# Patient Record
Sex: Female | Born: 1990 | Race: White | Hispanic: No | Marital: Single | Smoking: Never smoker
Health system: Southern US, Community
[De-identification: ages and names within clinical notes are randomized; demographics above are authoritative.]

## PROBLEM LIST (undated history)

## (undated) DIAGNOSIS — F419 Anxiety disorder, unspecified: Secondary | ICD-10-CM

## (undated) DIAGNOSIS — J302 Other seasonal allergic rhinitis: Secondary | ICD-10-CM

## (undated) DIAGNOSIS — G43909 Migraine, unspecified, not intractable, without status migrainosus: Secondary | ICD-10-CM

## (undated) DIAGNOSIS — T7840XA Allergy, unspecified, initial encounter: Secondary | ICD-10-CM

## (undated) HISTORY — DX: Allergy, unspecified, initial encounter: T78.40XA

## (undated) HISTORY — DX: Other seasonal allergic rhinitis: J30.2

## (undated) HISTORY — DX: Anxiety disorder, unspecified: F41.9

## (undated) HISTORY — PX: WISDOM TOOTH EXTRACTION: SHX21

## (undated) HISTORY — DX: Migraine, unspecified, not intractable, without status migrainosus: G43.909

---

## 2016-04-17 LAB — HM PAP SMEAR: HM PAP: NEGATIVE

## 2017-06-05 ENCOUNTER — Encounter: Payer: Self-pay | Admitting: Family Medicine

## 2017-06-05 ENCOUNTER — Ambulatory Visit (INDEPENDENT_AMBULATORY_CARE_PROVIDER_SITE_OTHER): Payer: 59 | Admitting: Family Medicine

## 2017-06-05 VITALS — BP 108/76 | HR 72 | Temp 98.2°F | Ht 71.75 in | Wt 190.2 lb

## 2017-06-05 DIAGNOSIS — Z13220 Encounter for screening for lipoid disorders: Secondary | ICD-10-CM | POA: Diagnosis not present

## 2017-06-05 DIAGNOSIS — F411 Generalized anxiety disorder: Secondary | ICD-10-CM | POA: Insufficient documentation

## 2017-06-05 DIAGNOSIS — G43109 Migraine with aura, not intractable, without status migrainosus: Secondary | ICD-10-CM | POA: Diagnosis not present

## 2017-06-05 DIAGNOSIS — Z0000 Encounter for general adult medical examination without abnormal findings: Secondary | ICD-10-CM

## 2017-06-05 DIAGNOSIS — Z23 Encounter for immunization: Secondary | ICD-10-CM | POA: Diagnosis not present

## 2017-06-05 DIAGNOSIS — G43909 Migraine, unspecified, not intractable, without status migrainosus: Secondary | ICD-10-CM | POA: Insufficient documentation

## 2017-06-05 DIAGNOSIS — J302 Other seasonal allergic rhinitis: Secondary | ICD-10-CM | POA: Insufficient documentation

## 2017-06-05 LAB — CBC
HCT: 43.1 % (ref 36.0–46.0)
HEMOGLOBIN: 14.5 g/dL (ref 12.0–15.0)
MCHC: 33.6 g/dL (ref 30.0–36.0)
MCV: 95.2 fl (ref 78.0–100.0)
PLATELETS: 309 10*3/uL (ref 150.0–400.0)
RBC: 4.53 Mil/uL (ref 3.87–5.11)
RDW: 13.3 % (ref 11.5–15.5)
WBC: 6.1 10*3/uL (ref 4.0–10.5)

## 2017-06-05 LAB — COMPREHENSIVE METABOLIC PANEL
ALK PHOS: 53 U/L (ref 39–117)
ALT: 8 U/L (ref 0–35)
AST: 10 U/L (ref 0–37)
Albumin: 4.3 g/dL (ref 3.5–5.2)
BILIRUBIN TOTAL: 0.8 mg/dL (ref 0.2–1.2)
BUN: 14 mg/dL (ref 6–23)
CALCIUM: 9.3 mg/dL (ref 8.4–10.5)
CO2: 27 meq/L (ref 19–32)
CREATININE: 0.76 mg/dL (ref 0.40–1.20)
Chloride: 105 mEq/L (ref 96–112)
GFR: 96.16 mL/min (ref >60.00)
Glucose, Bld: 82 mg/dL (ref 70–99)
Potassium: 4.3 mEq/L (ref 3.5–5.1)
Sodium: 139 mEq/L (ref 135–145)
TOTAL PROTEIN: 6.8 g/dL (ref 6.0–8.3)

## 2017-06-05 LAB — LIPID PANEL
CHOL/HDL RATIO: 2
Cholesterol: 179 mg/dL (ref 0–200)
HDL: 75.4 mg/dL (ref >39.00)
LDL Cholesterol: 95 mg/dL (ref 0–99)
NONHDL: 104.01
TRIGLYCERIDES: 45 mg/dL (ref 0.0–149.0)
VLDL: 9 mg/dL (ref 0.0–40.0)

## 2017-06-05 NOTE — Progress Notes (Signed)
Phone: [phone]  Subjective:  Patient presents today to establish care.  Prior patient of Haze RushingDeborah Cobb with [facility]. Chief complaint-noted.   See problem oriented charting  The following were reviewed and entered/updated in epic: Past Medical History:  Diagnosis Date  . Migraines    since gradeschool- advil and hydrates  . Seasonal allergies    claritin prn   Patient Active Problem List   Diagnosis Date Noted  . GAD (generalized anxiety disorder) 06/05/2017  . Seasonal allergies   . Migraines    Past Surgical History:  Procedure Laterality Date  . WISDOM TOOTH EXTRACTION     not general anesthesia    Family History  Problem Relation Age of Onset  . Arthritis Mother   . Hearing loss Father        work related  . Healthy Brother   . Uterine cancer Maternal Grandmother        died in early 6370s  . Pancreatic cancer Maternal Grandfather        in late 3860s  . Hypertension Paternal Grandfather        died at 8390  . Healthy Brother   . Colon cancer Maternal Uncle        in 5560s    Medications- reviewed and updated Current Outpatient Medications  Medication Sig Dispense Refill  . Levonorgestrel (SKYLA) 13.5 MG IUD Place 1 Device vaginally.     Allergies-reviewed and updated No Known Allergies  Social History   Socioeconomic History  . Marital status: Single    Spouse name: None  . Number of children: None  . Years of education: None  . Highest education level: None  Social Needs  . Financial resource strain: None  . Food insecurity - worry: None  . Food insecurity - inability: None  . Transportation needs - medical: None  . Transportation needs - non-medical: None  Occupational History  . None  Tobacco Use  . Smoking status: Never Smoker  . Smokeless tobacco: Never Used  Substance and Sexual Activity  . Alcohol use: Yes    Alcohol/week: 1.2 oz    Types: 2 Standard drinks or equivalent per week  . Drug use: No  . Sexual activity: No    Birth  control/protection: IUD  Other Topics Concern  . None  Social History Narrative   Lives with parents- goal this year is to move out.       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  Didn't want to do that for work.    Moved to [place] and worked at [employer] center for a while.    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       Hobbies: hiking, movies, video games- switch, ps4    ROS--Full ROS was completed Review of Systems  Constitutional: Negative for chills and fever.  HENT: Negative for hearing loss and tinnitus.   Eyes: Negative for blurred vision (wears contacts) and double vision.  Respiratory: Negative for cough and shortness of breath.   Cardiovascular: Negative for chest pain and palpitations.  Gastrointestinal: Negative for heartburn and nausea.  Genitourinary: Negative for dysuria and urgency.  Musculoskeletal: Negative for myalgias and neck pain.  Skin: Negative for itching and rash.  Neurological: Positive for headaches (very sparing migraine). Negative for dizziness.  Endo/Heme/Allergies: Negative for polydipsia. Does not bruise/bleed easily.  Psychiatric/Behavioral: Negative for hallucinations and substance abuse.   Objective: BP  108/76 (BP Location: Left Arm, Patient Position: Sitting, Cuff Size: Large)   Pulse 72   Temp 98.2 F (36.8 C) (Oral)   Ht 5' 11.75" (1.822 m)   Wt 190 lb 3.2 oz (86.3 kg)   LMP 06/30/2016   SpO2 98%   BMI 25.98 kg/m  Gen: NAD, resting comfortably, tall HEENT: Mucous membranes are moist. Oropharynx normal. TM normal. Eyes: sclera and lids normal, PERRLA Neck: no thyromegaly, no cervical lymphadenopathy CV: RRR no murmurs rubs or gallops Lungs: CTAB no crackles, wheeze, rhonchi Abdomen: soft/nontender/nondistended/normal bowel sounds. No rebound or guarding. Mildly overweight Ext: no edema Skin: warm, dry Neuro: 5/5 strength in upper and lower extremities,  normal gait, normal reflexes  Assessment/Plan:  27 y.o. female presenting for annual physical.  Health Maintenance counseling: 1. Anticipatory guidance: Patient counseled regarding regular dental exams -q6 months Dr. Dutch Quint, eye exams - yearly for contacts Dr. Hyacinth Meeker- sees in february, wearing seatbelts.  2. Risk factor reduction:  Advised patient of need for regular exercise and diet rich and fruits and vegetables to reduce risk of heart attack and stroke. Exercise- has started back 3x a week. Diet/weight- weight still largely reasonable- mildly overweight - had gotten down as low as 155. She woud like to get back to 150-160 range.   Wt Readings from Last 3 Encounters:  06/05/17 190 lb 3.2 oz (86.3 kg)  3. Immunizations/screenings/ancillary studies-declines flu.  Health Maintenance Due  Topic Date Due  . HIV Screening - has done this with GYN as well as donates blood regularly 03/18/2004  . TETANUS/TDAP - today 03/18/2008  . PAP SMEAR - get records 03/18/2010   4. Cervical cancer screening-  Get records is up to date though as of nov 2017 5. Breast cancer screening-  breast exam today and mammogram - start age 42 6. Colon cancer screening - no family history in first degree relative (uncle had this but mom was screened and was negative) , start at age 80-50 7. Skin cancer screening- advised regular sunscreen use. Denies worrisome, changing, or new skin lesions.  8. Birth control/STD check- IUD/ has done with GYN STD screening  Status of chronic or acute concerns   155 best weight. Wants to get in 150-160 range.   At [facility]- abd pain years ago. Appears treated for reflux. Also saw GI and was told no major issues.  No concerns at present.   Preventative health care - Plan: CBC, Comprehensive metabolic panel, Lipid panel  Seasonal allergies  Migraine with aura and without status migrainosus, not intractable - Plan: CBC, Comprehensive metabolic panel  GAD (generalized anxiety  disorder)  Screening for hyperlipidemia - Plan: Lipid panel  Tdap given. Should still be able to donate blood next week still as not a live vaccine.  Return precautions advised.  Tana Conch, MD

## 2017-06-05 NOTE — Patient Instructions (Addendum)
Sign release of information at the check out desk for last pap smear results from Dr. [place] CloudHorvath  Tdap today  Declines flu shot  Please stop by lab before you go

## 2017-07-03 ENCOUNTER — Encounter: Payer: Self-pay | Admitting: Family Medicine

## 2018-06-18 ENCOUNTER — Ambulatory Visit: Payer: 59 | Admitting: Family Medicine

## 2018-06-23 ENCOUNTER — Ambulatory Visit: Payer: 59 | Admitting: Family Medicine

## 2018-06-23 ENCOUNTER — Encounter: Payer: Self-pay | Admitting: Family Medicine

## 2018-06-23 VITALS — BP 100/70 | HR 101 | Temp 98.3°F | Ht 71.5 in | Wt 198.8 lb

## 2018-06-23 DIAGNOSIS — J40 Bronchitis, not specified as acute or chronic: Secondary | ICD-10-CM | POA: Diagnosis not present

## 2018-06-23 DIAGNOSIS — J329 Chronic sinusitis, unspecified: Secondary | ICD-10-CM | POA: Diagnosis not present

## 2018-06-23 DIAGNOSIS — B9689 Other specified bacterial agents as the cause of diseases classified elsewhere: Secondary | ICD-10-CM

## 2018-06-23 MED ORDER — AMOXICILLIN 875 MG PO TABS: 875.0000 mg | ORAL_TABLET | Freq: Two times a day (BID) | ORAL | 0 refills | Status: DC

## 2018-06-23 MED ORDER — FLUTICASONE PROPIONATE 50 MCG/ACT NA SUSP: 2.0000 | Freq: Every day | NASAL | 6 refills | Status: DC

## 2018-06-23 MED ORDER — HYDROCODONE-HOMATROPINE 5-1.5 MG/5ML PO SYRP: 5.0000 mL | ORAL_SOLUTION | Freq: Every evening | ORAL | 0 refills | Status: DC | PRN

## 2018-06-23 NOTE — Progress Notes (Signed)
Miranda Holt is a 28 y.o. female here for an acute visit.  History of Present Illness:   I, Waymon Amato, CMA acting as scribe for [employer].   Sore Throat   This is a new problem. The current episode started in the past 7 days. The problem has been gradually improving (but coughing is making it worse). Neither side of throat is experiencing more pain than the other. There has been no fever. The pain is at a severity of 5/10. The pain is mild. Associated symptoms include abdominal pain, coughing, headaches, neck pain and trouble swallowing. Associated symptoms comments: cough. She has tried oral narcotic analgesics, acetaminophen and NSAIDs (Tea) for the symptoms. The treatment provided mild relief.   PMHx, SurgHx, SocialHx, Medications, and Allergies were reviewed in the Visit Navigator and updated as appropriate.  Current Medications:   .  Levonorgestrel (SKYLA) 13.5 MG IUD, Place 1 Device vaginally., Disp: , Rfl:    No Known Allergies   Review of Systems:   Pertinent items are noted in the HPI. Otherwise, ROS is negative.  Vitals:   Vitals:   06/23/18 0938  BP: 100/70  Pulse: (!) 101  Temp: 98.3 F (36.8 C)  TempSrc: Oral  SpO2: 97%  Weight: 198 lb 12.8 oz (90.2 kg)  Height: 5' 11.5" (1.816 m)     Body mass index is 27.34 kg/m.  Physical Exam:   Physical Exam Vitals signs and nursing note reviewed.  HENT:     Head: Normocephalic and atraumatic.     Right Ear: Ear canal normal. A middle ear effusion is present.     Left Ear: Ear canal normal. A middle ear effusion is present.     Nose: Mucosal edema and rhinorrhea present.     Right Sinus: Maxillary sinus tenderness present.     Left Sinus: Maxillary sinus tenderness present.     Mouth/Throat:     Mouth: Mucous membranes are moist.     Pharynx: Posterior oropharyngeal erythema present.     Tonsils: No tonsillar exudate or tonsillar abscesses.  Eyes:     Conjunctiva/sclera: Conjunctivae normal.     Pupils:  Pupils are equal, round, and reactive to light.  Neck:     Musculoskeletal: Normal range of motion and neck supple.  Cardiovascular:     Rate and Rhythm: Normal rate and regular rhythm.     Heart sounds: Normal heart sounds.  Pulmonary:     Effort: Pulmonary effort is normal. No respiratory distress.  Abdominal:     Palpations: Abdomen is soft.  Skin:    General: Skin is warm.  Neurological:     General: No focal deficit present.     Mental Status: She is alert.  Psychiatric:        Mood and Affect: Mood normal.        Behavior: Behavior normal.    Assessment and Plan:   Miranda Holt was seen today for sore throat, cough, chills and generalized body aches.  Diagnoses and all orders for this visit:  Bacterial sinusitis -     fluticasone (FLONASE) 50 MCG/ACT nasal spray; Place 2 sprays into both nostrils daily. -     amoxicillin (AMOXIL) 875 MG tablet; Take 1 tablet (875 mg total) by mouth 2 (two) times daily.  Bronchitis -     HYDROcodone-homatropine (HYCODAN) 5-1.5 MG/5ML syrup; Take 5 mLs by mouth at bedtime as needed for cough.   . Reviewed expectations re: course of current medical issues. . Discussed self-management of  symptoms. . Outlined signs and symptoms indicating need for more acute intervention. . Patient verbalized understanding and all questions were answered. Marland Kitchen Health Maintenance issues including appropriate healthy diet, exercise, and smoking avoidance were discussed with patient. . See orders for this visit as documented in the electronic medical record. . Patient received an After Visit Summary.  CMA served as Neurosurgeon during this visit. History, Physical, and Plan performed by medical provider. The above documentation has been reviewed and is accurate and complete. Helane Rima, D.O.  Helane Rima, DO [facility] 06/23/2018

## 2018-07-06 ENCOUNTER — Ambulatory Visit: Payer: 59 | Admitting: Family Medicine

## 2018-07-06 ENCOUNTER — Encounter: Payer: Self-pay | Admitting: Family Medicine

## 2018-07-06 VITALS — BP 98/62 | HR 80 | Temp 98.4°F | Ht 71.5 in | Wt 201.0 lb

## 2018-07-06 DIAGNOSIS — Z6827 Body mass index (BMI) 27.0-27.9, adult: Secondary | ICD-10-CM

## 2018-07-06 DIAGNOSIS — F411 Generalized anxiety disorder: Secondary | ICD-10-CM | POA: Diagnosis not present

## 2018-07-06 NOTE — Patient Instructions (Addendum)
Health Maintenance Due  Topic Date Due  . INFLUENZA VACCINE - please have HR send [place] a copy of your flu shot 01/01/2018 Patient stated she had this done but unaware of date. Administered at work   Schedule a visit for a physical before march 31st and we will follow up at that time.   Consider lexapro 5mg  - read about that option Consider engaging in the community

## 2018-07-06 NOTE — Progress Notes (Signed)
Phone [phone]   Subjective:  Miranda Holt is a 28 y.o. year old very pleasant female patient who presents for/with See problem oriented charting ROS- admits to intense anxiety. Some feelings of being down related to anxiety. No suicidal ideation. No chest pain or shortness of breath.   BMI monitoring- elevated BMI noted: Body mass index is 27.64 kg/m. Encouraged need for healthy eating, regular exercise- may help with anxiety as well  BMI Metric Follow Up - 07/06/18 1601      BMI Metric Follow Up-Please document annually   BMI Metric Follow Up  Education provided       Past Medical History-  Patient Active Problem List   Diagnosis Date Noted  . Migraines     Priority: Medium  . GAD (generalized anxiety disorder) 06/05/2017    Priority: Low  . Seasonal allergies     Priority: Low   Medications- reviewed and updated Current Outpatient Medications  Medication Sig Dispense Refill  . amoxicillin (AMOXIL) 875 MG tablet Take 1 tablet (875 mg total) by mouth 2 (two) times daily. 20 tablet 0  . fluticasone (FLONASE) 50 MCG/ACT nasal spray Place 2 sprays into both nostrils daily. 16 g 6  . HYDROcodone-homatropine (HYCODAN) 5-1.5 MG/5ML syrup Take 5 mLs by mouth at bedtime as needed for cough. 120 mL 0  . Levonorgestrel (SKYLA) 13.5 MG IUD Place 1 Device vaginally.     No current facility-administered medications for this visit.      Objective:  BP 98/62 (BP Location: Right Arm, Patient Position: Sitting, Cuff Size: Large)   Pulse 80   Temp 98.4 F (36.9 C) (Oral)   Ht 5' 11.5" (1.816 m)   Wt 201 lb (91.2 kg)   LMP 06/22/2018   SpO2 96%   BMI 27.64 kg/m  Gen: NAD, resting comfortably PSych: anxious and tearful at times    Assessment and Plan   GAD (generalized anxiety disorder) S:Patient complains of recent increase in stress and anxiety.   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  -Dad may be  getting job in Software engineer- doesn't have good community outside of parents. Tends to be reserved. Not sure what she would do in that case.  -Pets are getting old and probably wont last long- fancy rat (hamster)- that is probably biggest stressor). She is very close to her - Shy kid since younger. Fear of being judged. 1 best friend- once that dissolved - hard to connect again (friend in HS essentially left her as wanted to focus on papularity) -Treated recently for bronchitis/bacterial sinusitis with amoxicillin- doing much better.  - 1 friend from college- she is a people pleaser and they went ot [employer] so she doesn't assert her needs. Feels like she doesn't matter in those situations -Efforts not rewarded in the past for trying to build relationships  Insurance doesn't cover counseling so going once a month now. Has to pay full thing. Goes about once a month but does find helpful- primarily for venting   Migraines had been worse lately- wondering if it was anticipation of beginning of semester. Now that semester has begun that has improved to baselineIntroverted. CounselorMainly listens and tries to get her to help solve her own problems. A/P: Extended counseling provided today -Ultimately patient wants to continue to work with her counselor - Suggested follow-up before March 31 for a physical and we can check in on current issues - Consider  Lexapro 5 mg- anxiety itself made her not want to trial this medication at present. -We discussed considering engaging in the community -PHQ 9 slightly over 5 but this is primarily due to anxiety   Time Stamp The duration of face-to-face time during this visit was greater than 25 minutes. Greater than 50% of this time was spent in counseling, explanation of diagnosis, planning of further management, and/or coordination of care including counseling over stressors, discussing medication options-discussing importance of follow-up.    Return precautions advised.   Tana ConchStephen Hunter, MD

## 2018-07-08 ENCOUNTER — Encounter: Payer: Self-pay | Admitting: Family Medicine

## 2018-07-08 NOTE — Assessment & Plan Note (Signed)
S:Patient complains of recent increase in stress and anxiety.   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  -Dad may be getting job in Software engineer- doesn't have good community outside of parents. Tends to be reserved. Not sure what she would do in that case.  -Pets are getting old and probably wont last long- fancy rat (hamster)- that is probably biggest stressor). She is very close to her - Shy kid since younger. Fear of being judged. 1 best friend- once that dissolved - hard to connect again (friend in HS essentially left her as wanted to focus on papularity) -Treated recently for bronchitis/bacterial sinusitis with amoxicillin- doing much better.  - 1 friend from college- she is a people pleaser and they went ot [employer] so she doesn't assert her needs. Feels like she doesn't matter in those situations -Efforts not rewarded in the past for trying to build relationships  Insurance doesn't cover counseling so going once a month now. Has to pay full thing. Goes about once a month but does find helpful- primarily for venting   Migraines had been worse lately- wondering if it was anticipation of beginning of semester. Now that semester has begun that has improved to baselineIntroverted. CounselorMainly listens and tries to get her to help solve her own problems. A/P: Extended counseling provided today -Ultimately patient wants to continue to work with her counselor - Suggested follow-up before March 31 for a physical and we can check in on current issues - Consider Lexapro 5 mg- anxiety itself made her not want to trial this medication at present. -We discussed considering engaging in the community -PHQ 9 slightly over 5 but this is primarily due to anxiety

## 2018-07-31 ENCOUNTER — Telehealth: Payer: Self-pay | Admitting: Family Medicine

## 2018-07-31 NOTE — Telephone Encounter (Signed)
See note

## 2018-07-31 NOTE — Telephone Encounter (Signed)
Copied from CRM [id number]. Topic: Quick Communication - See Telephone Encounter >> Jul 31, 2018  5:10 PM Jens Som A wrote: CRM for notification. See Telephone encounter for: 07/31/18.  Patient missed a call. Please advise with the patient thank you

## 2018-08-03 NOTE — Telephone Encounter (Signed)
Found out why pt received phone call. We are trying to figure out when pt received her Flu vaccine. Will try to call pt again.

## 2018-08-03 NOTE — Telephone Encounter (Signed)
Not sure why pt received a phone call. Left message to return phone call.

## 2018-08-04 NOTE — Telephone Encounter (Signed)
Noted  

## 2018-08-04 NOTE — Telephone Encounter (Signed)
Patient states she received flu vaccine 03/17/2018.

## 2018-08-24 ENCOUNTER — Encounter: Payer: 59 | Admitting: Family Medicine

## 2018-08-26 ENCOUNTER — Encounter: Payer: 59 | Admitting: Family Medicine

## 2018-10-01 LAB — HM PAP SMEAR: HM Pap smear: NEGATIVE

## 2018-12-01 ENCOUNTER — Ambulatory Visit (INDEPENDENT_AMBULATORY_CARE_PROVIDER_SITE_OTHER): Payer: 59 | Admitting: Family Medicine

## 2018-12-01 ENCOUNTER — Encounter: Payer: Self-pay | Admitting: Family Medicine

## 2018-12-01 ENCOUNTER — Other Ambulatory Visit: Payer: Self-pay

## 2018-12-01 VITALS — BP 120/82 | HR 97 | Temp 98.2°F | Ht 71.0 in | Wt 208.6 lb

## 2018-12-01 DIAGNOSIS — L989 Disorder of the skin and subcutaneous tissue, unspecified: Secondary | ICD-10-CM

## 2018-12-01 DIAGNOSIS — Z0000 Encounter for general adult medical examination without abnormal findings: Secondary | ICD-10-CM

## 2018-12-01 MED ORDER — TRIAMCINOLONE ACETONIDE 0.1 % EX CREA: 1.0000 "application " | TOPICAL_CREAM | Freq: Two times a day (BID) | CUTANEOUS | 0 refills | Status: DC

## 2018-12-01 NOTE — Progress Notes (Signed)
Phone: [phone]   Subjective:  Patient presents today for their annual physical. Chief complaint-noted.   See problem oriented charting- ROS- full  review of systems was completed and negative except for: headaches, seasonal allergies, anxiety  The following were reviewed and entered/updated in epic: Past Medical History:  Diagnosis Date  . Migraines    since gradeschool- advil and hydrates  . Seasonal allergies    claritin prn   Patient Active Problem List   Diagnosis Date Noted  . Migraines     Priority: Medium  . GAD (generalized anxiety disorder) 06/05/2017    Priority: Low  . Seasonal allergies     Priority: Low   Past Surgical History:  Procedure Laterality Date  . WISDOM TOOTH EXTRACTION     not general anesthesia    Family History  Problem Relation Age of Onset  . Arthritis Mother   . Hearing loss Father        work related  . Healthy Brother   . Uterine cancer Maternal Grandmother        died in early 12s  . Pancreatic cancer Maternal Grandfather        in late 19s  . Hypertension Paternal Grandfather        died at 70  . Healthy Brother   . Colon cancer Maternal Uncle        in 74s    Medications- reviewed and updated Current Outpatient Medications  Medication Sig Dispense Refill  . Levonorgestrel (KYLEENA) 19.5 MG IUD Kyleena 17.5 mcg/24 hrs (90yrs) 19.5mg  intrauterine device  Take 1 device every day by intrauterine route.    . triamcinolone cream (KENALOG) 0.1 % Apply 1 application topically 2 (two) times daily. For 7-10 days maximum 45 g 0   No current facility-administered medications for this visit.     Allergies-reviewed and updated No Known Allergies  Social History   Social History Narrative   Lives with parents- goal this year is to move out.          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   Didn't want to do that for work.    Moved to [place] and worked at [employer] center for a while.    Job at [employer].       Hobbies: hiking, movies, video games- switch, ps4   Objective  Objective:  BP 120/82 (BP Location: Left Arm, Patient Position: Sitting, Cuff Size: Normal)   Pulse 97   Temp 98.2 F (36.8 C)   Ht 5\' 11"  (1.803 m)   Wt 208 lb 9.6 oz (94.6 kg)   SpO2 (!) 81%   BMI 29.09 kg/m  Gen: NAD, resting comfortably HEENT: Mucous membranes are moist. Oropharynx normal Neck: no thyromegaly CV: RRR no murmurs rubs or gallops Lungs: CTAB no crackles, wheeze, rhonchi Abdomen: soft/nontender/nondistended/normal bowel sounds. No rebound or guarding.  Ext: no edema Skin: warm, dry, in left groin red raised area not clearly fluctuant that is painful to deep palpation- likely cyst- some excoriation and dried bandage residue Neuro: grossly normal, moves all extremities, PERRLA    Assessment and Plan   28 y.o. female presenting for annual physical.  Health Maintenance counseling: 1. Anticipatory guidance: Patient counseled regarding regular dental exams -q6 months with Dr. Jola Baptist, eye exams -yearly with Dr. Sabra Heck,  avoiding smoking and second  hand smoke , limiting alcohol to 1 beverage per day .   2. Risk factor reduction:  Advised patient of need for regular exercise and diet rich and fruits and vegetables to reduce risk of heart attack and stroke. Exercise- has been tough covid 19- no regular intentional exercise- plans to start this. Diet-feels like overeating- feels she needs to get a handle on that- feels like needs better self control.  Overweight status noted-weight creeping up.  190 last physical. Wt Readings from Last 3 Encounters:  12/01/18 208 lb 9.6 oz (94.6 kg)  07/06/18 201 lb (91.2 kg)  06/23/18 198 lb 12.8 oz (90.2 kg)  3. Immunizations/screenings/ancillary studies- up to date Immunization History  Administered Date(s) Administered  .  Influenza-Unspecified 03/17/2018  . Tdap 06/05/2017  4. Cervical cancer screening- follows with Dr. [place] CloudHorvath. Last pap we have on file 04/17/2016- had pap smear April 3rd and not sure if had pap 5. Breast cancer screening-  breast exam-with Dr. Kirby FunkHorvath-and mammogram- likely yearly starting at age 7-no family history 6. Colon cancer screening - maternal uncle in 4260s- would start screening at least age 30, consider 4845 if any other family history develops 7. Skin cancer screening- no dermatologist. advised regular sunscreen use. Denies worrisome, changing, or new skin lesions.  8. Birth control/STD check- IUD in place- recently placed-STD screening with gynecology- not active since last time tested 9. Osteoporosis screening at 65-we will plan on this 10.  Never smoker  Status of chronic or acute concerns  Generalized Anxiety Disorder - continues to work with therapist- therapist thought lexapro 5 mg was a good idea. Her mother was against it. Working at home and her quality of life has improved markedly- several stressors now gone. Parents didn't move. Her fancy rats died x1 and had to put the other one down. Still worried about work, school (I believe finishing later this year), what future holds. At least social anxiety is bette.  - still considering lexapro 5 mg low dose- she will reach out if wants to start. Already counseled about medicine today  Seasonal Allergies - takes sudafed- discussed can contribute to anxiety. Didn't like flonase- burning sensation in nose. Used to take claritin.   Migraines - migraines a few times a year.   Headaches on daily basis- tends to happen in spring. Ibuprofen still helps both migraines and tension headaches  Left groin lesion/skin lesion- will refer to dermatology for evaluation. Will trial triamcinolone cream for itch. May be cyst- did a lot of scratching in area nad seemed to enlarge from under half a cm.   1 year physical or sooner if needed Lab/Order  associations: no labs today    ICD-10-CM   1. Preventative health care  Z00.00   2. Skin lesion of left leg  L98.9 Ambulatory referral to Dermatology   Meds ordered this encounter  Medications  . triamcinolone cream (KENALOG) 0.1 %    Sig: Apply 1 application topically 2 (two) times daily. For 7-10 days maximum    Dispense:  45 g    Refill:  0    Return precautions advised.  Tana ConchStephen Giuliana [place], MD

## 2018-12-01 NOTE — Patient Instructions (Addendum)
Sign release of information at the check out desk for last pap smear from Dr. Philis Pique- last we have on file is 2017  We will call you within two weeks about your referral to dermatology. If you do not hear within 3 weeks, give [place] a call. Try triamcinolone cream for the itch. If area gets bigger/redder/more painful let us know- we may have to do an incision and drainage- though I think its just an irritated cyst- not an infected cyst at present.   We opted to hold off on labs as looked good in 2019- can repeat next year. If work needs them we can still add these at later date.   1 year physical or sooner if needed   If we start lexapro (we can do this without a visit since we discussed the plan today) - want to have already gone over this info --> Taking the medicine as directed and not missing any doses is one of the best things you can do to treat your anxiety.  Here are some things to keep in mind:  1) Side effects (stomach upset, some increased anxiety) may happen before you notice a benefit.  These side effects typically go away over time. 2) Changes to your dose of medicine or a change in medication all together is sometimes necessary 3) Most people need to be on medication at least 6-12 months 4) Many people will notice an improvement within two weeks but the full effect of the medication can take up to 4-6 weeks 5) Stopping the medication when you start feeling better often results in a return of symptoms 6) If you start having thoughts of hurting yourself or others after starting this medicine, call our office immediately at [phone] or seek care through 911.   7) mild increased risk of GI bleed with lexapro and advil

## 2019-08-25 ENCOUNTER — Encounter: Payer: Self-pay | Admitting: Family Medicine

## 2019-12-02 ENCOUNTER — Encounter: Payer: Self-pay | Admitting: Family Medicine

## 2019-12-02 ENCOUNTER — Ambulatory Visit: Payer: No Typology Code available for payment source | Admitting: Family Medicine

## 2019-12-02 ENCOUNTER — Other Ambulatory Visit: Payer: Self-pay

## 2019-12-02 VITALS — BP 100/80 | HR 104 | Temp 99.5°F | Ht 71.0 in | Wt 211.4 lb

## 2019-12-02 DIAGNOSIS — Z13220 Encounter for screening for lipoid disorders: Secondary | ICD-10-CM

## 2019-12-02 DIAGNOSIS — Z1159 Encounter for screening for other viral diseases: Secondary | ICD-10-CM

## 2019-12-02 DIAGNOSIS — F411 Generalized anxiety disorder: Secondary | ICD-10-CM | POA: Diagnosis not present

## 2019-12-02 DIAGNOSIS — Z0001 Encounter for general adult medical examination with abnormal findings: Secondary | ICD-10-CM

## 2019-12-02 DIAGNOSIS — Z0000 Encounter for general adult medical examination without abnormal findings: Secondary | ICD-10-CM | POA: Diagnosis not present

## 2019-12-02 DIAGNOSIS — G43109 Migraine with aura, not intractable, without status migrainosus: Secondary | ICD-10-CM | POA: Diagnosis not present

## 2019-12-02 MED ORDER — SUMATRIPTAN SUCCINATE 50 MG PO TABS: 50.0000 mg | ORAL_TABLET | ORAL | 1 refills | Status: DC | PRN

## 2019-12-02 MED ORDER — HYDROXYZINE HCL 25 MG PO TABS: 25.0000 mg | ORAL_TABLET | Freq: Three times a day (TID) | ORAL | 2 refills | Status: DC | PRN

## 2019-12-02 NOTE — Progress Notes (Signed)
Phone [phone] In person visit   Subjective:   Miranda Holt is a 29 y.o. year old very pleasant female patient who presents for/with See problem oriented charting This visit occurred during the SARS-CoV-2 public health emergency.  Safety protocols were in place, including screening questions prior to the visit, additional usage of staff PPE, and extensive cleaning of exam room while observing appropriate contact time as indicated for disinfecting solutions.   Past Medical History-  Patient Active Problem List   Diagnosis Date Noted  . Migraines     Priority: Medium  . GAD (generalized anxiety disorder) 06/05/2017    Priority: Low  . Seasonal allergies     Priority: Low    Medications- reviewed and updated Current Outpatient Medications  Medication Sig Dispense Refill  . Levonorgestrel (KYLEENA) 19.5 MG IUD Kyleena 17.5 mcg/24 hrs (9yrs) 19.5mg  intrauterine device  Take 1 device every day by intrauterine route.    . hydrOXYzine (ATARAX/VISTARIL) 25 MG tablet Take 1 tablet (25 mg total) by mouth every 8 (eight) hours as needed for anxiety. 60 tablet 2  . SUMAtriptan (IMITREX) 50 MG tablet Take 1 tablet (50 mg total) by mouth every 2 (two) hours as needed for migraine (max 2 per day. max 2 days a week.). May repeat in 2 hours if headache persists or recurs. 10 tablet 1  . triamcinolone cream (KENALOG) 0.1 % Apply 1 application topically 2 (two) times daily. For 7-10 days maximum 45 g 0   No current facility-administered medications for this visit.     Objective:  BP 100/80   Pulse (!) 104   Temp 99.5 F (37.5 C)   Ht 5\' 11"  (1.803 m)   Wt 211 lb 6.4 oz (95.9 kg)   SpO2 95%   BMI 29.48 kg/m  Gen: NAD, resting comfortably No sinus pressure Neuro: CN II-XII intact, sensation and reflexes normal throughout, 5/5 muscle strength in bilateral upper and lower extremities. Normal finger to nose. Normal rapid alternating movements. No pronator drift. Normal romberg. Normal gait.       Assessment and Plan   GAD (generalized anxiety disorder) # Anxiety/GAD S:Medication: had considered lexapro 5 mg but patient and mom have thought more about an as needed medicine.   - CBD oil has been tried but not helpful. Very sparingly shot of whisky will help. Does not regularly - on a bad week would have 2 shots.  Counseling:  Has stopped working with her therapist- didn't feel a good connection towards the end of her sessions and would like a new therapist.  GAD 7 : Generalized Anxiety Score 12/02/2019 12/01/2018 07/06/2018  Nervous, Anxious, on Edge 3 2 3   Control/stop worrying 3 2 3   Worry too much - different things 3 2 3   Trouble relaxing 0 1 2  Restless 0 1 2  Easily annoyed or irritable 2 1 3   Afraid - awful might happen 2 1 3   Total GAD 7 Score 13 10 19   Anxiety Difficulty Somewhat difficult Somewhat difficult Somewhat difficult  A/P: poor control- will trial hydroxyzine as she would prefer to use an as needed medication over Lexapro for now. -Next potential treatment we would consider buspirone -After that would consider Lexapro if needed -Also we are going to give her the name and number of our behavioral health to see if we can get her plugged in there  Migraines  S:sudafed and advill together tend to help headaches and perceived congestion. Has a lot of frontal sinus pressure. Blows out  clear discharge at times. Has taken allegra in the past without relief. Almost daily issue for months- takes sudafed and advil daily.   Usually one sided above eye and lasts 4 hours. Can nausea with them.   This feels different from her migraines- has no aura.    Not related to headaches has had times when feels like room is moving with changes in head position- like a slight motion sensation but getting slightly better. Feeling of still being in ocean like when you go to beach and then come back to room and get in bed or after headache felt like could still feel movement. No hearing  loss or ringing in the ears. No facial or extremity weakness. No slurred words or trouble swallowing. no blurry vision or double vision. No paresthesias. No confusion or word finding difficulties.  A/P: possible sinusitis vs. Severe allergies vs. Migraines vs. Medication overuse headaches - reassuring neurological exam  -trial off advil for 3-4 weeks to see if we can break cycle - can use sumatriptan up to twice a week- if not effective should stop -happy to place neurology referral if not improving.  - if worsening sinus pressure or change in color- id be willing to send in antibiotic to see if that helps - on progesterone only birth control - kyleena - if she were to change plans on pregnancy (abstinent plus kyleena)- she knows to let us know as hydroxyzine and sumatriptan not advised in pregnancy  For vertigo- does not seem related If you have recurrent issues of feeling like room moving Alpha Gula, MD Vertigo Treatment Oct 11 [email or web address]   Recommended follow up: Return in about 3 months (around 03/03/2020) for follow up- or sooner if needed.   Lab/Order associations:   ICD-10-CM   2. GAD (generalized anxiety disorder)  F41.1 CBC with Differential/Platelet    Comprehensive metabolic panel  3. Migraine with aura and without status migrainosus, not intractable  G43.109 CBC with Differential/Platelet    Meds ordered this encounter  Medications  . hydrOXYzine (ATARAX/VISTARIL) 25 MG tablet    Sig: Take 1 tablet (25 mg total) by mouth every 8 (eight) hours as needed for anxiety.    Dispense:  60 tablet    Refill:  2  . SUMAtriptan (IMITREX) 50 MG tablet    Sig: Take 1 tablet (50 mg total) by mouth every 2 (two) hours as needed for migraine (max 2 per day. max 2 days a week.). May repeat in 2 hours if headache persists or recurs.    Dispense:  10 tablet    Refill:  1    Return precautions advised.  Tana Conch, MD

## 2019-12-02 NOTE — Assessment & Plan Note (Addendum)
  S:sudafed and advill together tend to help headaches and perceived congestion. Has a lot of frontal sinus pressure. Blows out clear discharge at times. Has taken allegra in the past without relief. Almost daily issue for months- takes sudafed and advil daily.   Usually one sided above eye and lasts 4 hours. Can nausea with them.   This feels different from her migraines- has no aura.    Not related to headaches has had times when feels like room is moving with changes in head position- like a slight motion sensation but getting slightly better. Feeling of still being in ocean like when you go to beach and then come back to room and get in bed or after headache felt like could still feel movement. No hearing loss or ringing in the ears. No facial or extremity weakness. No slurred words or trouble swallowing. no blurry vision or double vision. No paresthesias. No confusion or word finding difficulties.  A/P: possible sinusitis vs. Severe allergies vs. Migraines vs. Medication overuse headaches - reassuring neurological exam  -trial off advil for 3-4 weeks to see if we can break cycle - can use sumatriptan up to twice a week- if not effective should stop -happy to place neurology referral if not improving.  - if worsening sinus pressure or change in color- id be willing to send in antibiotic to see if that helps - on progesterone only birth control - kyleena - if she were to change plans on pregnancy (abstinent plus kyleena)- she knows to let us know as hydroxyzine and sumatriptan not advised in pregnancy  For vertigo- does not seem related If you have recurrent issues of feeling like room moving Alpha Gula, MD Vertigo Treatment Oct 11 [email or web address]

## 2019-12-02 NOTE — Progress Notes (Signed)
Phone [phone]   Subjective:  Patient presents today for their annual physical. Chief complaint-noted.   See problem oriented charting- ROS- full  review of systems was completed and negative except for: headaches vs. Migraines, vertigo at times not related, anxiety  The following were reviewed and entered/updated in epic: Past Medical History:  Diagnosis Date  . Migraines    since gradeschool- advil and hydrates  . Seasonal allergies    claritin prn   Patient Active Problem List   Diagnosis Date Noted  . Migraines     Priority: Medium  . GAD (generalized anxiety disorder) 06/05/2017    Priority: Low  . Seasonal allergies     Priority: Low   Past Surgical History:  Procedure Laterality Date  . WISDOM TOOTH EXTRACTION     not general anesthesia    Family History  Problem Relation Age of Onset  . Arthritis Mother   . Hearing loss Father        work related  . Healthy Brother   . Uterine cancer Maternal Grandmother        died in early 51s  . Pancreatic cancer Maternal Grandfather        in late 31s  . Hypertension Paternal Grandfather        died at 23  . Healthy Brother   . Colon cancer Maternal Uncle        in 51s    Medications- reviewed and updated Current Outpatient Medications  Medication Sig Dispense Refill  . Levonorgestrel (KYLEENA) 19.5 MG IUD Kyleena 17.5 mcg/24 hrs (42yrs) 19.5mg  intrauterine device  Take 1 device every day by intrauterine route.    . triamcinolone cream (KENALOG) 0.1 % Apply 1 application topically 2 (two) times daily. For 7-10 days maximum 45 g 0   No current facility-administered medications for this visit.    Allergies-reviewed and updated No Known Allergies  Social History   Social History Narrative   Lives with parents- goal this year is to move out.          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  Didn't want to do that for work.    Moved to [place] and worked at [employer] center for a while.    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       Hobbies: hiking, movies, video games- switch, ps4   Objective  Objective:  BP 100/80   Pulse (!) 104   Temp 99.5 F (37.5 C)   Ht 5\' 11"  (1.803 m)   Wt 211 lb 6.4 oz (95.9 kg)   SpO2 95%   BMI 29.48 kg/m  Gen: NAD, resting comfortably HEENT: Mucous membranes are moist. Oropharynx normal Neck: no thyromegaly CV: RRR no murmurs rubs or gallops. HR high normal on my exam  Lungs: CTAB no crackles, wheeze, rhonchi Abdomen: soft/nontender/nondistended/normal bowel sounds. No rebound or guarding.  Ext: no edema Skin: warm, dry Neuro: grossly normal, moves all extremities, PERRLA. Also see full neuro on separate sheet.    Assessment and Plan   29 y.o. female presenting for annual physical.  Health Maintenance counseling: 1. Anticipatory guidance: Patient counseled regarding regular dental exams q6 months Dr. 26, eye exams with Dr. Dutch Quint for contacts,  avoiding smoking and second hand smoke, limiting alcohol to 1 beverage per day- 1 per week.   2.  Risk factor reduction:  Advised patient of need for regular exercise and diet rich and fruits and vegetables to reduce risk of heart attack and stroke. Exercise- could be better- just started right before visit. Diet-cooks mostly at home. She feels she could cut down on carbs.  Wt Readings from Last 3 Encounters:  12/02/19 211 lb 6.4 oz (95.9 kg)  12/01/18 208 lb 9.6 oz (94.6 kg)  07/06/18 201 lb (91.2 kg)  3. Immunizations/screenings/ancillary studies- did not get flu shot 2020.  Immunization History  Administered Date(s) Administered  . Influenza-Unspecified 03/17/2018  . Janssen (J&J) SARS-COV-2 Vaccination 09/11/2019  . Tdap 06/05/2017  4. Cervical cancer screening- with Dr. Henderson Cloud. Saw march 23- thinks they did pap- we will get recors 5. Breast cancer screening-  breast exam  with GYN and mammogram - start at 40, possible baseline at 33 6. Colon cancer screening - colon cancer maternal uncle in 7s- still would start her screening at 49.  7. Skin cancer screening- ended up not seeing dermatology because area cleared up that we were concerned about last year. advised regular sunscreen use. Denies worrisome, changing, or new skin lesions.  8. Birth control/STD check- IUD and abstinence- declines std screening 9. Osteoporosis screening at 22- will plan on this -Never smoker  Status of chronic or acute concerns   See problem oriented separate visit/charting  Occasional small bumps on roof of mouth or tongue- that eventually go away. Main triggers- muffins or some bread products, cheeses. Will monitor if do not resolve. Muffins seem to bother her the most. Could be preservative or packaging related as well- could try home cooked muffins to see if similar. Might be a sensitivity- will monitor. If worsening can let us know   Screen hyperlipidemia- she requests repeat Lab Results  Component Value Date   CHOL 179 06/05/2017   HDL 75.40 06/05/2017   LDLCALC 95 06/05/2017   TRIG 45.0 06/05/2017   CHOLHDL 2 06/05/2017   Recommended follow up: Return in about 3 months (around 03/03/2020) for follow up- or sooner if needed.  Lab/Order associations:not fasting   ICD-10-CM   1. Encounter for general adult medical examination with abnormal findings  Z00.01 CBC with Differential/Platelet    Comprehensive metabolic panel    Lipid panel    Hepatitis C Antibody  4. Encounter for hepatitis C screening test for low risk patient  Z11.59 Hepatitis C Antibody   Return precautions advised.  Tana Conch, MD

## 2019-12-02 NOTE — Patient Instructions (Addendum)
Please stop by lab before you go If you have mychart- we will send your results within 3 business days of [place] receiving them.  If you do not have mychart- we will call you about results within 5 business days of [place] receiving them.   Stop by the front desk and fill out a release of information to have a copy of your most recent PAP sent to [place].  Trial hydroxyzine for anxiety as needed. Be cautious about tiredness/driving for 6-8 hours after using.   Please call [phone] to schedule a visit with [facility] -Colen Darling is an excellent counselor who is based out of our clinic  possible sinusitis vs. Severe allergies vs. Migraines vs. Medication overuse headaches -trial off advil for 3-4 weeks to see if we can break cycle - can use sumatriptan up to twice a week- if not effective should stop -happy to place neurology referral if not improving.  - if worsening sinus pressure or change in color- id be willing to send in antibiotic to see if that helps -could retrial allegra or claritin  If you have recurrent issues of feeling like room moving Alpha Gula, MD Vertigo Treatment Oct 11 [email or web address]  - if she were to change plans on pregnancy (abstinent plus kyleena)- she knows to let us know as hydroxyzine and sumatriptan not advised in pregnancy

## 2019-12-02 NOTE — Assessment & Plan Note (Signed)
#   Anxiety/GAD S:Medication: had considered lexapro 5 mg but patient and mom have thought more about an as needed medicine.   - CBD oil has been tried but not helpful. Very sparingly shot of whisky will help. Does not regularly - on a bad week would have 2 shots.  Counseling:  Has stopped working with her therapist- didn't feel a good connection towards the end of her sessions and would like a new therapist.  GAD 7 : Generalized Anxiety Score 12/02/2019 12/01/2018 07/06/2018  Nervous, Anxious, on Edge 3 2 3   Control/stop worrying 3 2 3   Worry too much - different things 3 2 3   Trouble relaxing 0 1 2  Restless 0 1 2  Easily annoyed or irritable 2 1 3   Afraid - awful might happen 2 1 3   Total GAD 7 Score 13 10 19   Anxiety Difficulty Somewhat difficult Somewhat difficult Somewhat difficult  A/P: poor control- will trial hydroxyzine as she would prefer to use an as needed medication over Lexapro for now. -Next potential treatment we would consider buspirone -After that would consider Lexapro if needed -Also we are going to give her the name and number of our behavioral health to see if we can get her plugged in there

## 2019-12-03 LAB — COMPREHENSIVE METABOLIC PANEL
ALT: 9 U/L (ref 0–35)
AST: 10 U/L (ref 0–37)
Albumin: 4.8 g/dL (ref 3.5–5.2)
Alkaline Phosphatase: 54 U/L (ref 39–117)
BUN: 15 mg/dL (ref 6–23)
CO2: 24 mEq/L (ref 19–32)
Calcium: 9.8 mg/dL (ref 8.4–10.5)
Chloride: 102 mEq/L (ref 96–112)
Creatinine, Ser: 0.79 mg/dL (ref 0.40–1.20)
GFR: 85.05 mL/min (ref >60.00)
Glucose, Bld: 85 mg/dL (ref 70–99)
Potassium: 4.1 mEq/L (ref 3.5–5.1)
Sodium: 136 mEq/L (ref 135–145)
Total Bilirubin: 0.4 mg/dL (ref 0.2–1.2)
Total Protein: 7.4 g/dL (ref 6.0–8.3)

## 2019-12-03 LAB — LIPID PANEL
Cholesterol: 232 mg/dL — ABNORMAL HIGH (ref 0–200)
HDL: 75.4 mg/dL (ref >39.00)
LDL Cholesterol: 143 mg/dL — ABNORMAL HIGH (ref 0–99)
NonHDL: 156.48
Total CHOL/HDL Ratio: 3
Triglycerides: 68 mg/dL (ref 0.0–149.0)
VLDL: 13.6 mg/dL (ref 0.0–40.0)

## 2019-12-03 LAB — CBC WITH DIFFERENTIAL/PLATELET
Basophils Absolute: 0.1 10*3/uL (ref 0.0–0.1)
Basophils Relative: 1 % (ref 0.0–3.0)
Eosinophils Absolute: 0.1 10*3/uL (ref 0.0–0.7)
Eosinophils Relative: 0.9 % (ref 0.0–5.0)
HCT: 43.7 % (ref 36.0–46.0)
Hemoglobin: 15.2 g/dL — ABNORMAL HIGH (ref 12.0–15.0)
Lymphocytes Relative: 31.6 % (ref 12.0–46.0)
Lymphs Abs: 2.6 10*3/uL (ref 0.7–4.0)
MCHC: 34.7 g/dL (ref 30.0–36.0)
MCV: 94 fl (ref 78.0–100.0)
Monocytes Absolute: 0.5 10*3/uL (ref 0.1–1.0)
Monocytes Relative: 6 % (ref 3.0–12.0)
Neutro Abs: 5 10*3/uL (ref 1.4–7.7)
Neutrophils Relative %: 60.5 % (ref 43.0–77.0)
Platelets: 328 10*3/uL (ref 150.0–400.0)
RBC: 4.64 Mil/uL (ref 3.87–5.11)
RDW: 12.8 % (ref 11.5–15.5)
WBC: 8.3 10*3/uL (ref 4.0–10.5)

## 2019-12-07 LAB — HEPATITIS C ANTIBODY
Hepatitis C Ab: NONREACTIVE
SIGNAL TO CUT-OFF: 0.01 (ref <1.00)

## 2020-03-03 ENCOUNTER — Encounter: Payer: Self-pay | Admitting: Family Medicine

## 2020-03-03 ENCOUNTER — Ambulatory Visit (INDEPENDENT_AMBULATORY_CARE_PROVIDER_SITE_OTHER): Payer: PRIVATE HEALTH INSURANCE | Admitting: Family Medicine

## 2020-03-03 ENCOUNTER — Other Ambulatory Visit: Payer: Self-pay

## 2020-03-03 VITALS — BP 116/62 | HR 71 | Temp 99.5°F | Resp 18 | Ht 71.0 in | Wt 192.0 lb

## 2020-03-03 DIAGNOSIS — F411 Generalized anxiety disorder: Secondary | ICD-10-CM | POA: Diagnosis not present

## 2020-03-03 DIAGNOSIS — G43109 Migraine with aura, not intractable, without status migrainosus: Secondary | ICD-10-CM

## 2020-03-03 MED ORDER — ESCITALOPRAM OXALATE 10 MG PO TABS: 10.0000 mg | ORAL_TABLET | Freq: Every day | ORAL | 5 refills | Status: DC

## 2020-03-03 NOTE — Assessment & Plan Note (Signed)
S:Medication:  hydroxyzine trial last visit. She preferred not to use daily medicine like lexapro at that time. Also had considered buspirone. Gave her # for behavioral health  Counseling: did not have the best fit with prior therapist and costly. Prefers in person- not a lot of great options for any person or easily finding in person therapist  She has done very well with hydroxyzine at night and helps her sleep- had been having issues sleeping due to anxiety before. She is unable to tolerate in the daytime as makes her too sleepy.   Stressors right now are work and worries about the future- parents being gone. Moved into townhome A/P: Generalized anxiety disorder remains poorly controlled.  Slight improvement on hydroxyzine but still not at goal.  We are going to start Lexapro 10 mg-we will start with 1/2 tablet for a week and then increase to full tablet.  We will follow-up in 8 weeks.  If she has new or worsening symptoms let us know

## 2020-03-03 NOTE — Patient Instructions (Addendum)
Health Maintenance Due  Topic Date Due  . INFLUENZA VACCINE - declined 01/02/2020   Trial lexapro 10mg . Start half tablet for first week. Can still use hydroxyzine  Taking the medicine as directed and not missing any doses is one of the best things you can do to treat your anxiety.  Here are some things to keep in mind:  1) Side effects (stomach upset, some increased anxiety) may happen before you notice a benefit.  These side effects typically go away over time. 2) Changes to your dose of medicine or a change in medication all together is sometimes necessary 3) Most people need to be on medication at least 6-12 months 4) Many people will notice an improvement within two weeks but the full effect of the medication can take up to 6-8 weeks 5) Stopping the medication when you start feeling better often results in a return of symptoms 6) If you start having thoughts of hurting yourself or others after starting this medicine, call our office immediately at [phone] or seek care through 911.    Recommended follow up: Return in about 2 months (around 05/03/2020).

## 2020-03-03 NOTE — Assessment & Plan Note (Signed)
#   Migraines/rebound headaches S: Last visit patient was using Sudafed and Advil together for perceived congestion-had a lot of frontal sinus pressure-blows out clear discharge at times.  Allegra in the past was not helpful.  With a daily issue for months and was taking Sudafed and Advil daily-recommended trial off both for at least 3 to 4 weeks.  Was getting some nausea with these episodes and concern could be migraines.  She felt like they were different from her typical migraines and has had no aura.  Reassuring neurological exam last visit.  Discussed using sumatriptan up to twice a week and can try to break rebound headache cycle.  Discussed possible neurology referral.  We discussed if worsening sinus pressure or change in color we could try antibiotic.  She was already on progesterone only birth control [place] and is abstinent.   Possible BPPV- discussed carol foster youtbe videos.  Patient did not mention vertigo today  Sumatriptan did not help so likely not migraines. Cutting out advil was very helpful- using very sparingly now twice a month or less.  A/P: It sounds like patient was certainly having rebound headaches-has gone from daily Advil and Sudafed to extremely sparing use now twice a month or less of Advil.  These headaches were not responsive to sumatriptan so did not sound like her migraines.  She still has sumatriptan on hand for her migraines which are associated with aura -In regards to migraine with aura thankfully she is on progesterone only contraceptive

## 2020-03-03 NOTE — Progress Notes (Signed)
Phone [phone] In person visit   Subjective:   Miranda Holt is a 29 y.o. year old very pleasant female patient who presents for/with See problem oriented charting Chief Complaint  Patient presents with  . Follow-up    will do flu vaccine today   This visit occurred during the SARS-CoV-2 public health emergency.  Safety protocols were in place, including screening questions prior to the visit, additional usage of staff PPE, and extensive cleaning of exam room while observing appropriate contact time as indicated for disinfecting solutions.   Past Medical History-  Patient Active Problem List   Diagnosis Date Noted  . Migraines     Priority: Medium  . GAD (generalized anxiety disorder) 06/05/2017    Priority: Low  . Seasonal allergies     Priority: Low    Medications- reviewed and updated Current Outpatient Medications  Medication Sig Dispense Refill  . hydrOXYzine (ATARAX/VISTARIL) 25 MG tablet Take 1 tablet (25 mg total) by mouth every 8 (eight) hours as needed for anxiety. 60 tablet 2  . Levonorgestrel (KYLEENA) 19.5 MG IUD Kyleena 17.5 mcg/24 hrs (48yrs) 19.5mg  intrauterine device  Take 1 device every day by intrauterine route.    . SUMAtriptan (IMITREX) 50 MG tablet Take 1 tablet (50 mg total) by mouth every 2 (two) hours as needed for migraine (max 2 per day. max 2 days a week.). May repeat in 2 hours if headache persists or recurs. 10 tablet 1  . escitalopram (LEXAPRO) 10 MG tablet Take 1 tablet (10 mg total) by mouth daily. 30 tablet 5   No current facility-administered medications for this visit.     Objective:  BP 116/62 (BP Location: Left Arm, Patient Position: Sitting, Cuff Size: Small)   Pulse 71   Temp 99.5 F (37.5 C) (Temporal)   Resp 18   Ht 5\' 11"  (1.803 m)   Wt 192 lb (87.1 kg)   LMP  (LMP Unknown)   SpO2 97%   BMI 26.78 kg/m  Gen: NAD, resting comfortably CV: RRR no murmurs rubs or gallops Lungs: CTAB no crackles, wheeze, rhonchi Ext: no  edema Skin: warm, dry     Assessment and Plan   #Generalized anxiety disorder S:Medication:  hydroxyzine trial last visit. She preferred not to use daily medicine like lexapro at that time. Also had considered buspirone. Gave her # for behavioral health  Counseling: did not have the best fit with prior therapist and costly. Prefers in person- not a lot of great options for any person or easily finding in person therapist  She has done very well with hydroxyzine at night and helps her sleep- had been having issues sleeping due to anxiety before. She is unable to tolerate in the daytime as makes her too sleepy.   Stressors right now are work and worries about the future- parents being gone. Moved into townhome A/P: Generalized anxiety disorder remains poorly controlled.  Slight improvement on hydroxyzine but still not at goal- doesn't tolerate in daytime.  We are going to start Lexapro 10 mg-we will start with 1/2 tablet for a week and then increase to full tablet.  We will follow-up in 8 weeks.  If she has new or worsening symptoms let know  # Migraines/rebound headaches S: Last visit patient was using Sudafed and Advil together for perceived congestion-had a lot of frontal sinus pressure-blows out clear discharge at times.  Allegra in the past was not helpful.  With a daily issue for months and was taking Sudafed and  Advil daily-recommended trial off both for at least 3 to 4 weeks.  Was getting some nausea with these episodes and concern could be migraines.  She felt like they were different from her typical migraines and has had no aura.  Reassuring neurological exam last visit.  Discussed using sumatriptan up to twice a week and can try to break rebound headache cycle.  Discussed possible neurology referral.  We discussed if worsening sinus pressure or change in color we could try antibiotic.  She was already on progesterone only birth control [place] and is abstinent.   Possible BPPV-  discussed carol foster youtbe videos.  Patient did not mention vertigo today  Sumatriptan did not help so likely not migraines. Cutting out advil was very helpful- using very sparingly now twice a month or less.  A/P: It sounds like patient was certainly having rebound headaches-has gone from daily Advil and Sudafed to extremely sparing use now twice a month or less of Advil.  These headaches were not responsive to sumatriptan so did not sound like her migraines.  She still has sumatriptan on hand for her migraines which are associated with aura -In regards to migraine with aura thankfully she is on progesterone only contraceptive   Recommended follow up: Return in about 2 months (around 05/03/2020). Future Appointments  Date Time Provider Department Center  12/05/2020  4:00 PM Shelva Majestic, MD [facility]   Lab/Order associations: No diagnosis found.  Meds ordered this encounter  Medications  . escitalopram (LEXAPRO) 10 MG tablet    Sig: Take 1 tablet (10 mg total) by mouth daily.    Dispense:  30 tablet    Refill:  5    Return precautions advised.  Tana Conch, MD

## 2020-05-17 NOTE — Progress Notes (Signed)
Phone [phone] In person visit   Subjective:   Miranda Holt is a 29 y.o. year old very pleasant female patient who presents for/with See problem oriented charting Chief Complaint  Patient presents with   Anxiety    Lexapro medication follow up    This visit occurred during the SARS-CoV-2 public health emergency.  Safety protocols were in place, including screening questions prior to the visit, additional usage of staff PPE, and extensive cleaning of exam room while observing appropriate contact time as indicated for disinfecting solutions.   Past Medical History-  Patient Active Problem List   Diagnosis Date Noted   GAD (generalized anxiety disorder) 06/05/2017    Priority: Medium   Migraines     Priority: Medium   Seasonal allergies     Priority: Low    Medications- reviewed and updated Current Outpatient Medications  Medication Sig Dispense Refill   hydrOXYzine (ATARAX/VISTARIL) 25 MG tablet Take 1 tablet (25 mg total) by mouth every 8 (eight) hours as needed for anxiety. 60 tablet 2   Levonorgestrel (KYLEENA) 19.5 MG IUD Kyleena 17.5 mcg/24 hrs (23yrs) 19.5mg  intrauterine device  Take 1 device every day by intrauterine route.     escitalopram (LEXAPRO) 10 MG tablet Take 1 tablet (10 mg total) by mouth daily. 90 tablet 3   SUMAtriptan (IMITREX) 50 MG tablet Take 1 tablet (50 mg total) by mouth every 2 (two) hours as needed for migraine (max 2 per day. max 2 days a week.). May repeat in 2 hours if headache persists or recurs. (Patient not taking: Reported on 05/18/2020) 10 tablet 1   No current facility-administered medications for this visit.     Objective:  BP 122/81    Pulse 82    Temp 98.7 F (37.1 C) (Temporal)    Ht 5\' 11"  (1.803 m)    Wt 213 lb 9.6 oz (96.9 kg)    SpO2 95%    BMI 29.79 kg/m  Gen: NAD, resting comfortably     Assessment and Plan   # GAD S:Medication: lexapro 10mg  , also hydroxyzine for sleep Counseling:  has not scheduled therapy yet  as prefers in person and tough to find that at present  Patient has noted some improvement on lexapro 10mg  but wonders if she could see further improvement. Mom has noted improvement (sometimes needs outside observer).  GAD 7 : Generalized Anxiety Score 05/18/2020 12/02/2019 12/01/2018 07/06/2018  Nervous, Anxious, on Edge 1 3 2 3   Control/stop worrying 1 3 2 3   Worry too much - different things 3 3 2 3   Trouble relaxing 1 0 1 2  Restless 1 0 1 2  Easily annoyed or irritable 1 2 1 3   Afraid - awful might happen 1 2 1 3   Total GAD 7 Score 9 13 10 19   Anxiety Difficulty Not difficult at all Somewhat difficult Somewhat difficult Somewhat difficult   Depression screen [facility] 2/9 05/18/2020 12/02/2019 12/01/2018  Decreased Interest 0 0 0  Down, Depressed, Hopeless 0 2 0  PHQ - 2 Score 0 2 0  Altered sleeping - 2 1  Tired, decreased energy - 2 0  Change in appetite - 0 0  Feeling bad or failure about yourself  - 2 0  Trouble concentrating - 1 1  Moving slowly or fidgety/restless - 0 0  Suicidal thoughts - 0 0  PHQ-9 Score - 9 2  Difficult doing work/chores - Somewhat difficult Not difficult at all  A/P: improved control with GAD7 down from  13 to 9 and rating now as no difficulty doing normal tasks in the day. Still with overall poor control- will titrate to 20mg  of lexapro and schedule follow up in 2-3 months (if she is doing really well can simply keep her July CPE)    Hydroxyzine helpful for sleep- we will continue this as well.   Recommended follow up: see above Future Appointments  Date Time Provider Department Center  12/07/2020  4:00 PM 02/07/2021, MD [facility]    Lab/Order associations:   ICD-10-CM   1. GAD (generalized anxiety disorder)  F41.1     Meds ordered this encounter  Medications   escitalopram (LEXAPRO) 20 MG tablet    Sig: Take 1 tablet (20 mg total) by mouth daily.    Dispense:  90 tablet    Refill:  3   Return precautions advised.  Shelva Majestic, MD

## 2020-05-18 ENCOUNTER — Ambulatory Visit (INDEPENDENT_AMBULATORY_CARE_PROVIDER_SITE_OTHER): Payer: PRIVATE HEALTH INSURANCE | Admitting: Family Medicine

## 2020-05-18 ENCOUNTER — Other Ambulatory Visit: Payer: Self-pay

## 2020-05-18 ENCOUNTER — Encounter: Payer: Self-pay | Admitting: Family Medicine

## 2020-05-18 VITALS — BP 122/81 | HR 82 | Temp 98.7°F | Ht 71.0 in | Wt 213.6 lb

## 2020-05-18 DIAGNOSIS — F411 Generalized anxiety disorder: Secondary | ICD-10-CM

## 2020-05-18 MED ORDER — ESCITALOPRAM OXALATE 20 MG PO TABS: 20.0000 mg | ORAL_TABLET | Freq: Every day | ORAL | 3 refills | Status: DC

## 2020-05-18 NOTE — Patient Instructions (Addendum)
  Health Maintenance Due  Topic Date Due  . COVID-19 Vaccine (2 - Booster for [employer] series) - consider switching and doing one of the MRNA vaccines x2  11/06/2019   Lexapro 10mg - take two until you run out then pick up 20 mg dose. Since you have improved so much lets do 2-3 month follow up.   If any thoughts of self harm please call or 911 though I do not expect this to happen  Hope you have a [place] Christmas!

## 2020-07-07 ENCOUNTER — Other Ambulatory Visit: Payer: Self-pay

## 2020-07-07 MED ORDER — HYDROXYZINE HCL 25 MG PO TABS: 25.0000 mg | ORAL_TABLET | Freq: Three times a day (TID) | ORAL | 2 refills | Status: DC | PRN

## 2020-09-30 ENCOUNTER — Other Ambulatory Visit: Payer: Self-pay | Admitting: Family Medicine

## 2020-12-01 LAB — HM PAP SMEAR

## 2020-12-05 ENCOUNTER — Encounter: Payer: No Typology Code available for payment source | Admitting: Family Medicine

## 2020-12-07 ENCOUNTER — Encounter: Payer: Self-pay | Admitting: Family Medicine

## 2020-12-07 ENCOUNTER — Telehealth: Payer: Self-pay

## 2020-12-07 ENCOUNTER — Other Ambulatory Visit: Payer: Self-pay

## 2020-12-07 ENCOUNTER — Ambulatory Visit (INDEPENDENT_AMBULATORY_CARE_PROVIDER_SITE_OTHER): Payer: PRIVATE HEALTH INSURANCE | Admitting: Family Medicine

## 2020-12-07 VITALS — BP 115/76 | HR 72 | Temp 99.0°F | Ht 71.0 in | Wt 232.6 lb

## 2020-12-07 DIAGNOSIS — Z0000 Encounter for general adult medical examination without abnormal findings: Secondary | ICD-10-CM | POA: Diagnosis not present

## 2020-12-07 DIAGNOSIS — G43009 Migraine without aura, not intractable, without status migrainosus: Secondary | ICD-10-CM | POA: Diagnosis not present

## 2020-12-07 DIAGNOSIS — Z1283 Encounter for screening for malignant neoplasm of skin: Secondary | ICD-10-CM

## 2020-12-07 DIAGNOSIS — E785 Hyperlipidemia, unspecified: Secondary | ICD-10-CM

## 2020-12-07 DIAGNOSIS — F411 Generalized anxiety disorder: Secondary | ICD-10-CM

## 2020-12-07 MED ORDER — HYDROXYZINE HCL 25 MG PO TABS: 25.0000 mg | ORAL_TABLET | Freq: Three times a day (TID) | ORAL | 3 refills | Status: DC | PRN

## 2020-12-07 MED ORDER — ESCITALOPRAM OXALATE 20 MG PO TABS: 20.0000 mg | ORAL_TABLET | Freq: Every day | ORAL | 3 refills | Status: DC

## 2020-12-07 NOTE — Patient Instructions (Addendum)
Health Maintenance Due  Topic Date Due   COVID-19 Vaccine (2 - Booster for Miranda Holt series) Patient will call back or send a mychart message with the dates of pfizer booster 11/06/2019   We will call you within two weeks about your referral to Dermatology. If you do not hear within 2 weeks, give [place] a call.   Please stop by lab before you go If you have mychart- we will send your results within 3 business days of [place] receiving them.  If you do not have mychart- we will call you about results within 5 business days of [place] receiving them.  *please also note that you will see labs on mychart as soon as they post. I will later go in and write notes on them- will say "notes from Dr. Durene Cal"  If you do experience a migraine episode, please take one dose of Imitrex 50 mg- if this is ineffective, you can do another repeat dose 2 hours later. If this does not work, you may stop taking this medication.  Congratulations for your drastic change on your GAD score while on Lexapro! If any worsening symptoms let us know- continue current meds for now  Recommended follow up: Return in about 1 year (around 12/07/2021) for physical or sooner if needed.

## 2020-12-07 NOTE — Progress Notes (Signed)
Phone [phone]   Subjective:  Patient presents today for their annual physical. Chief complaint-noted.   See problem oriented charting- ROS- full  review of systems was completed and negative except for: seasonal allergies  Lab Results  Component Value Date   CHOL 232 (H) 12/02/2019   HDL 75.40 12/02/2019   LDLCALC 143 (H) 12/02/2019   TRIG 68.0 12/02/2019   CHOLHDL 3 12/02/2019    The following were reviewed and entered/updated in epic: Past Medical History:  Diagnosis Date   Migraines    since gradeschool- advil and hydrates   Seasonal allergies    claritin prn   Patient Active Problem List   Diagnosis Date Noted   GAD (generalized anxiety disorder) 06/05/2017    Priority: Medium   Migraines     Priority: Medium   Seasonal allergies     Priority: Low   Past Surgical History:  Procedure Laterality Date   WISDOM TOOTH EXTRACTION     not general anesthesia    Family History  Problem Relation Age of Onset   Arthritis Mother    Hearing loss Father        work related   Healthy Brother    Uterine cancer Maternal Grandmother        died in early 59s   Pancreatic cancer Maternal Grandfather        in late 2s   Hypertension Paternal Grandfather        died at 39   Healthy Brother    Colon cancer Maternal Uncle        in 84s    Medications- reviewed and updated Current Outpatient Medications  Medication Sig Dispense Refill   Levonorgestrel (KYLEENA) 19.5 MG IUD Kyleena 17.5 mcg/24 hrs (79yrs) 19.5mg  intrauterine device  Take 1 device every day by intrauterine route.     escitalopram (LEXAPRO) 20 MG tablet Take 1 tablet (20 mg total) by mouth daily. 90 tablet 3   hydrOXYzine (ATARAX/VISTARIL) 25 MG tablet Take 1 tablet (25 mg total) by mouth every 8 (eight) hours as needed for anxiety. 90 tablet 3   No current facility-administered medications for this visit.    Allergies-reviewed and updated No Known Allergies  Social History   Social History  Narrative   Lives with parents- goal this year is to move out.          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  Didn't want to do that for work.    Moved to [place] and worked at [employer] center for a while.    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       Hobbies: hiking, movies, video games- switch, ps4   Objective  Objective:  BP 115/76   Pulse 72   Temp 99 F (37.2 C) (Temporal)   Ht 5\' 11"  (1.803 m)   Wt 232 lb 9.6 oz (105.5 kg)   SpO2 94%   BMI 32.44 kg/m  Gen: NAD, resting comfortably HEENT: Mucous membranes are moist. Oropharynx normal Neck: no thyromegaly CV: RRR no murmurs rubs or gallops Lungs: CTAB no crackles, wheeze, rhonchi Abdomen: soft/nontender/nondistended/normal bowel sounds. No rebound or guarding.  Ext: no edema Skin: warm, dry Neuro: grossly normal, moves all extremities, PERRLA   Assessment and Plan   30 y.o. female presenting for annual physical.  Health Maintenance counseling: 1. Anticipatory guidance:  Patient counseled regarding regular dental exams -q6 months with Dr. Dutch Quint, eye exams -yearly with Dr. Hyacinth Meeker,  avoiding smoking and second hand smoke , limiting alcohol to 1 beverage per day .   2. Risk factor reduction:  Advised patient of need for regular exercise and diet rich and fruits and vegetables to reduce risk of heart attack and stroke. Exercise- Had plans to start this back in 2020- as soon as gets car out of shop she is planning on restarting at the gym- thinks that will be the big driver for getting weight down. Diet-unfortunately patient has gained 19 pounds in the last 6 months- thinks gym would help but we discussed working on dietary changes as well  Wt Readings from Last 3 Encounters:  12/07/20 232 lb 9.6 oz (105.5 kg)  05/18/20 213 lb 9.6 oz (96.9 kg)  03/03/20 192 lb (87.1 kg)  3.  Immunizations/screenings/ancillary studies- COVID-19- mrna booster - had one booster. Has not had covid as far as she knows Immunization History  Administered Date(s) Administered   Influenza-Unspecified 03/17/2018   Janssen (J&J) SARS-COV-2 Vaccination 09/11/2019   Tdap 06/05/2017  4. Cervical cancer screening- follows with Dr. Henderson Cloud. Last on 10/01/2018 with a 3-year repeat planned - had one this year actually- we will await records 5. Breast cancer screening-  breast exam -with Dr. Henderson Cloud and mammogram -likely yearly starting at age 35- no family history 6. Colon cancer screening - maternal uncle in 60s-discussed starting colonoscopy at age 38 unless other family history develops 7. Skin cancer screening- no dermatologist. advised regular sunscreen use. Denies worrisome, changing, or new skin lesions.  8. Birth control/STD check-KYLEENA IUD was in place and noted in 2020- STD screening with GYN 9. Osteoporosis screening at 69- we will plan on this 10. Never smoker  Status of chronic or acute concerns   # Anxiety/GAD S:Medication:  Lexapro 20 mg (has felt slightly lazier on this), Hydroxyzine 25 mg nightly Counseling: not in therapy A/P: Drastic improvement in GAD on Lexapro 20 mg along with hydroxyzine 25 mg nightly with GAD 7 score of 0-continue current medication  # Seasonal Allergies S:seems to actually do better with hydroxyzine- even tolerating having a cat in home which has not in past  A/P: doing well- continue to monitor   # Migraines #Headaches on daily basis S:medication: Imitrex 50 mg as needed in past- only tried once and not effective.  -headaches tend to occur in the Spring. Reported ibuprofen helped both migraines and headaches. -very rare right now- once every few months  -regular headaches not as bad either A/P: patient can retry next time and repeat in 2 hours if needed- if not effective we can stop this potential treatment- she still has pills available to try    Recommended follow up: Return in about 1 year (around 12/07/2021) for physical or sooner if needed.  Lab/Order associations: NOT fasting   ICD-10-CM   1. Preventative health care  Z00.00     2. GAD (generalized anxiety disorder)  F41.1     3. Migraine without aura and without status migrainosus, not intractable  G43.009     4. Skin cancer screening  Z12.83 Ambulatory referral to Dermatology    5. Hyperlipidemia, unspecified hyperlipidemia type  E78.5 TSH    CBC with Differential/Platelet    Comprehensive metabolic panel    Lipid panel      Meds ordered this encounter  Medications   hydrOXYzine (ATARAX/VISTARIL) 25 MG tablet    Sig: Take 1 tablet (  25 mg total) by mouth every 8 (eight) hours as needed for anxiety.    Dispense:  90 tablet    Refill:  3   escitalopram (LEXAPRO) 20 MG tablet    Sig: Take 1 tablet (20 mg total) by mouth daily.    Dispense:  90 tablet    Refill:  3     Return precautions advised.  Tana Conch, MD

## 2020-12-07 NOTE — Telephone Encounter (Signed)
Pateint stated that the nurse asked her to call back to let us know when her Covid booster was. She had her booster on 05/31/20

## 2020-12-08 LAB — COMPREHENSIVE METABOLIC PANEL
ALT: 12 U/L (ref 0–35)
AST: 14 U/L (ref 0–37)
Albumin: 4.5 g/dL (ref 3.5–5.2)
Alkaline Phosphatase: 50 U/L (ref 39–117)
BUN: 12 mg/dL (ref 6–23)
CO2: 25 mEq/L (ref 19–32)
Calcium: 9.4 mg/dL (ref 8.4–10.5)
Chloride: 104 mEq/L (ref 96–112)
Creatinine, Ser: 0.74 mg/dL (ref 0.40–1.20)
GFR: 107.58 mL/min (ref >60.00)
Glucose, Bld: 78 mg/dL (ref 70–99)
Potassium: 4.4 mEq/L (ref 3.5–5.1)
Sodium: 138 mEq/L (ref 135–145)
Total Bilirubin: 0.4 mg/dL (ref 0.2–1.2)
Total Protein: 7.4 g/dL (ref 6.0–8.3)

## 2020-12-08 LAB — CBC WITH DIFFERENTIAL/PLATELET
Basophils Absolute: 0.1 10*3/uL (ref 0.0–0.1)
Basophils Relative: 1.2 % (ref 0.0–3.0)
Eosinophils Absolute: 0.1 10*3/uL (ref 0.0–0.7)
Eosinophils Relative: 2 % (ref 0.0–5.0)
HCT: 40.7 % (ref 36.0–46.0)
Hemoglobin: 13.9 g/dL (ref 12.0–15.0)
Lymphocytes Relative: 32.8 % (ref 12.0–46.0)
Lymphs Abs: 2.2 10*3/uL (ref 0.7–4.0)
MCHC: 34.2 g/dL (ref 30.0–36.0)
MCV: 91.8 fl (ref 78.0–100.0)
Monocytes Absolute: 0.4 10*3/uL (ref 0.1–1.0)
Monocytes Relative: 5.6 % (ref 3.0–12.0)
Neutro Abs: 4 10*3/uL (ref 1.4–7.7)
Neutrophils Relative %: 58.4 % (ref 43.0–77.0)
Platelets: 333 10*3/uL (ref 150.0–400.0)
RBC: 4.43 Mil/uL (ref 3.87–5.11)
RDW: 12.9 % (ref 11.5–15.5)
WBC: 6.8 10*3/uL (ref 4.0–10.5)

## 2020-12-08 LAB — LIPID PANEL
Cholesterol: 226 mg/dL — ABNORMAL HIGH (ref 0–200)
HDL: 70.6 mg/dL (ref >39.00)
LDL Cholesterol: 140 mg/dL — ABNORMAL HIGH (ref 0–99)
NonHDL: 155.13
Total CHOL/HDL Ratio: 3
Triglycerides: 78 mg/dL (ref 0.0–149.0)
VLDL: 15.6 mg/dL (ref 0.0–40.0)

## 2020-12-08 LAB — TSH: TSH: 2.12 u[IU]/mL (ref 0.35–5.50)

## 2020-12-08 NOTE — Telephone Encounter (Signed)
Called and spoke with pt and she received pfizer, immunization has been documented.

## 2021-11-09 ENCOUNTER — Ambulatory Visit: Payer: PRIVATE HEALTH INSURANCE | Admitting: Family Medicine

## 2021-11-09 ENCOUNTER — Encounter: Payer: Self-pay | Admitting: Family Medicine

## 2021-11-09 VITALS — BP 110/62 | HR 64 | Temp 99.1°F | Ht 71.0 in | Wt 268.0 lb

## 2021-11-09 DIAGNOSIS — R200 Anesthesia of skin: Secondary | ICD-10-CM | POA: Diagnosis not present

## 2021-11-09 DIAGNOSIS — M533 Sacrococcygeal disorders, not elsewhere classified: Secondary | ICD-10-CM

## 2021-11-09 MED ORDER — MELOXICAM 7.5 MG PO TABS: 7.5000 mg | ORAL_TABLET | Freq: Every day | ORAL | 0 refills | Status: DC

## 2021-11-09 NOTE — Progress Notes (Signed)
Phone [phone] In person visit   Subjective:   Miranda Holt is a 31 y.o. year old very pleasant female patient who presents for/with See problem oriented charting Chief Complaint  Patient presents with   pins and needles    Pt c/o pin and needle sensation in left foot that has been around for about a month.    Past Medical History-  Patient Active Problem List   Diagnosis Date Noted   GAD (generalized anxiety disorder) 06/05/2017    Priority: Medium    Migraines     Priority: Medium    Seasonal allergies     Priority: Low    Medications- reviewed and updated Current Outpatient Medications  Medication Sig Dispense Refill   escitalopram (LEXAPRO) 20 MG tablet Take 1 tablet (20 mg total) by mouth daily. 90 tablet 3   hydrOXYzine (ATARAX/VISTARIL) 25 MG tablet Take 1 tablet (25 mg total) by mouth every 8 (eight) hours as needed for anxiety. 90 tablet 3   Levonorgestrel (KYLEENA) 19.5 MG IUD Kyleena 17.5 mcg/24 hrs (42yrs) 19.5mg  intrauterine device  Take 1 device every day by intrauterine route.     meloxicam (MOBIC) 7.5 MG tablet Take 1 tablet (7.5 mg total) by mouth daily. 10 tablet 0   No current facility-administered medications for this visit.     Objective:  BP 110/62   Pulse 64   Temp 99.1 F (37.3 C)   Ht 5\' 11"  (1.803 m)   Wt 268 lb (121.6 kg)   SpO2 97%   BMI 37.38 kg/m   CV: RRR  Lungs: nonlabored, normal respiratory rate Abdomen: soft/nondistended  Ext: no edema Skin: warm, dry, no rash Good range of motion of the foot Neuro: Normal sensation to monofilament on the left foot, 2+ DP and PT pulses    Assessment and Plan   #Social update-received a promotion at work!  Congratulated her  #Paresthesias with left foot numbness #Coccydynia S: Patient started noticing pins and needle sensation in the left foot present for about a month. Varies in intensity. Not painful.   She literally sits on the foot (pulls the left foot underneath her body to help  offload her coccyx) and it gets progressively numb and she moves with some improvement but also with lingering sensation. Enjoys sitting that way and reduces tail bone pain which she has had since Sept 2021. . New chair since march 2021- was ok until September when aggravated (denies fall or injury) but you keep having the [place] keeps it on a ride tailbone on way [place] A/P: Patient with coccydynia and recommended taking meloxicam for 10 days to try to reduce this discomfort.  We discussed multiple potential therapies and next steps-ultimately she preferred to follow-up with sports medicine in 10 to 14 days to see if any further adjustments can be made for her coccyx pain because if she feels better from that she will feel more comfortable not sitting on the foot -Weight loss would certainly be helpful but that is not a short-term possibility -Discussed other accommodations such as standing desk or new chair-she has an anxiety about asking for accommodations.  She also does not want to stand at work where others can see her computer screen and she feels like she stands out due to her height  For paresthesias I do not think there is a good solution for this other than stopping sitting on the foot and seeing if this resolves-likely nerve compression related and if does not resolve may need  further work-up-sports medicine can also help with this if not improving  # Obesity S: Unfortunately progressive substantial weight gain over the last year Wt Readings from Last 3 Encounters:  11/09/21 268 lb (121.6 kg)  12/07/20 232 lb 9.6 oz (105.5 kg)  05/18/20 213 lb 9.6 oz (96.9 kg)   A/P: Poor control of weight- Encouraged need for healthy eating, regular exercise, weight loss.  We discussed most of weight loss is going to be associated with change in dietary intake and not primarily exercise but both encouraged -Lexapro likely contributes but it has really helped with her anxiety and we opted to continue  this  Recommended follow up: Return in about 6 months (around 05/11/2022) for physical or sooner if needed.Schedule b4 you leave. -Sooner follow-up if coccydynia or left foot numbness not improving Future Appointments  Date Time Provider Department Center  05/16/2022  3:00 PM Shelva Majestic, MD [facility]    Lab/Order associations:   ICD-10-CM   1. Coccydynia  M53.3 Ambulatory referral to Sports Medicine    2. Numbness of left foot  R20.0 Ambulatory referral to Sports Medicine      Meds ordered this encounter  Medications   meloxicam (MOBIC) 7.5 MG tablet    Sig: Take 1 tablet (7.5 mg total) by mouth daily.    Dispense:  10 tablet    Refill:  0    Time Spent: 35 minutes of total time (3:55 PM-4:30 PM) was spent on the date of the encounter performing the following actions: chart review prior to seeing the patient, obtaining history, performing a medically necessary exam, counseling on the work-up, follow-up and treatment plan, placing orders, and documenting in our EHR.    Return precautions advised.  Tana Conch, MD

## 2021-11-09 NOTE — Patient Instructions (Addendum)
Health Maintenance Due  Topic Date Due   PAP SMEAR-Modifier  09/30/2021  Pretty please schedule this!  I think the originating issue is your coccyx related pain-lets try 10 days of an anti-inflammatory meloxicam (let us know if any bleeding issues or reflux issues) to try to calm this down and also try to work aggressively on finding a good pillow to offload pressure preferably 1 that leaves space open in the back and use this all the time at work.  I think this is created a secondary issue of nerve compression in the foot-I want to try to keep you from sitting on your foot for at least a good month.  Try to schedule sports medicine visit about 10-14 days out from today  Recommended follow up: Return in about 6 months (around 05/11/2022) for physical or sooner if needed.Schedule b4 you leave.

## 2021-11-14 ENCOUNTER — Ambulatory Visit (INDEPENDENT_AMBULATORY_CARE_PROVIDER_SITE_OTHER): Payer: PRIVATE HEALTH INSURANCE

## 2021-11-14 ENCOUNTER — Ambulatory Visit: Payer: PRIVATE HEALTH INSURANCE | Admitting: Sports Medicine

## 2021-11-14 VITALS — BP 118/80 | HR 83 | Ht 71.0 in | Wt 268.0 lb

## 2021-11-14 DIAGNOSIS — M533 Sacrococcygeal disorders, not elsewhere classified: Secondary | ICD-10-CM

## 2021-11-14 MED ORDER — MELOXICAM 15 MG PO TABS: 15.0000 mg | ORAL_TABLET | Freq: Every day | ORAL | 0 refills | Status: DC

## 2021-11-14 NOTE — Progress Notes (Signed)
Aleen Sells [facility] [address] [place] [address] Phone: [phone]   Assessment and Plan:     1. Pain in sacrum 2. Coccydynia -Chronic, unchanged, initial sports medicine visit - Coccydynia originating from sitting in an abnormal position during a 5-hour car ride in 02/2021, that has not resolved -- Start meloxicam 15 mg daily x3 weeks.  May use remaining meloxicam as needed once daily for pain control.  Do not to use additional NSAIDs while taking meloxicam.  May use Tylenol [phone] mg 2 to 3 times a day for breakthrough pain.  Patient concerned about the adverse reaction of taking NSAIDs and SSRIs with increased risk of gastric bleeding.  Advised patient that we will only be using NSAIDs for the next 3 weeks and do not plan on continuing it chronically which decreases chance of gastric bleed - X-ray today in clinic.  My interpretation: No acute fracture or dislocation.  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  Could consider ultrasound versus MRI based on patient's symptoms.  Would discontinue meloxicam at that visit   Subjective:   I, Jerene Canny, am serving as a Neurosurgeon for Doctor Richardean Sale  Chief Complaint: left foot pain , coccydynia   HPI:   11/14/21 Patient is a 31 year old female complaining of left foot pain and coccydynia . Patient states She literally sits on the foot (pulls the left foot underneath her body to help offload her coccyx) and it gets progressively numb and she moves with some improvement but also with lingering sensation. Enjoys sitting that way and reduces tail bone pain which she has had since Sept 2022. Marland Kitchen Foot numbness started a couple of months ago do to the coccydynia pain , no MOI , was rx  meloxicam  has started the meloxicam 7.5 mg and hasn't done anything started Saturday    Relevant Historical Information: Elevated BMI  Additional pertinent review of systems negative.   Current Outpatient Medications:    escitalopram (LEXAPRO) 20 MG tablet, Take 1 tablet (20 mg total) by mouth daily., Disp: 90 tablet, Rfl: 3   hydrOXYzine (ATARAX/VISTARIL) 25 MG tablet, Take 1 tablet (25 mg total) by mouth every 8 (eight) hours as needed for anxiety., Disp: 90 tablet, Rfl: 3   Levonorgestrel (KYLEENA) 19.5 MG IUD, Kyleena 17.5 mcg/24 hrs (65yrs) 19.5mg  intrauterine device  Take 1 device every day by intrauterine route., Disp: , Rfl:    meloxicam (MOBIC) 15 MG tablet, Take 1 tablet (15 mg total) by mouth daily., Disp: 21 tablet, Rfl: 0   meloxicam (MOBIC) 7.5 MG tablet, Take 1 tablet (7.5 mg total) by mouth daily., Disp: 10 tablet, Rfl: 0   Objective:     Vitals:   11/14/21 1523  BP: 118/80  Pulse: 83  SpO2: 96%  Weight: 268 lb (121.6 kg)  Height: 5\' 11"  (1.803 m)      Body mass index is 37.38 kg/m.    Physical Exam:    General: awake, alert, and oriented no acute distress, nontoxic Skin: no suspicious lesions or rashes Neuro:sensation intact distally with no dificits, normal muscle tone, no atrophy, strength 5/5 in all tested lower ext groups Psych: normal mood and affect, speech clear  Bilateral hip/pelvis   No deformity, swelling or wasting Hip ROM  Flexion 90, ext 30, IR 45, ER 45 TTP sacral base NTTP over the hip flexors, greater troch, glute musculature, si joint, lumbar spine Negative log roll with FROM Negative FABER Negative FADIR Negative Piriformis test   Gait normal    Electronically signed by:  Aleen Sells [facility] 4:04 PM 11/14/21

## 2021-11-14 NOTE — Patient Instructions (Addendum)
Good to see you  - Start meloxicam 15 mg daily x3 weeks. May use remaining meloxicam as needed once daily for pain control.  Do not to use additional NSAIDs while taking meloxicam.  May use Tylenol [phone] mg 2 to 3 times a day for breakthrough pain. Use coccyx pillow especially when at work  3-4 week follow up

## 2021-12-05 ENCOUNTER — Other Ambulatory Visit: Payer: Self-pay | Admitting: Family Medicine

## 2021-12-05 NOTE — Progress Notes (Unsigned)
    Miranda Holt [facility] [address] [place] [address] Phone: [phone]   Assessment and Plan:     There are no diagnoses linked to this encounter.  ***   Pertinent previous records reviewed include ***   Follow Up: ***     Subjective:   I, Miranda Holt, am serving as a Neurosurgeon for Doctor Richardean Sale   Chief Complaint: left foot pain , coccydynia    HPI:    11/14/21 Patient is a 31 year old female complaining of left foot pain and coccydynia . Patient states She literally sits on the foot (pulls the left foot underneath her body to help offload her coccyx) and it gets progressively numb and she moves with some improvement but also with lingering sensation. Enjoys sitting that way and reduces tail bone pain which she has had since Sept 2022. Marland Kitchen Foot numbness started a couple of months ago do to the coccydynia pain , no MOI , was rx meloxicam  has started the meloxicam 7.5 mg and hasn't done anything started Saturday   12/06/2021 Patient states      Relevant Historical Information: Elevated BMI  Additional pertinent review of systems negative.   Current Outpatient Medications:    escitalopram (LEXAPRO) 20 MG tablet, TAKE 1 TABLET(20 MG) BY MOUTH DAILY, Disp: 90 tablet, Rfl: 3   hydrOXYzine (ATARAX/VISTARIL) 25 MG tablet, Take 1 tablet (25 mg total) by mouth every 8 (eight) hours as needed for anxiety., Disp: 90 tablet, Rfl: 3   Levonorgestrel (KYLEENA) 19.5 MG IUD, Kyleena 17.5 mcg/24 hrs (94yrs) 19.5mg  intrauterine device  Take 1 device every day by intrauterine route., Disp: , Rfl:    meloxicam (MOBIC) 15 MG tablet, Take 1 tablet (15 mg total) by mouth daily., Disp: 21 tablet, Rfl: 0   meloxicam (MOBIC) 7.5 MG tablet, Take 1 tablet (7.5 mg total) by mouth daily., Disp: 10 tablet, Rfl: 0   Objective:     There were no vitals filed for this visit.    There is no height or weight on file to calculate BMI.    Physical  Exam:    ***   Electronically signed by:  Miranda Holt [facility] 11:52 AM 12/05/21

## 2021-12-06 ENCOUNTER — Ambulatory Visit: Payer: PRIVATE HEALTH INSURANCE | Admitting: Sports Medicine

## 2021-12-06 VITALS — BP 130/80 | HR 70 | Ht 71.0 in | Wt 268.0 lb

## 2021-12-06 DIAGNOSIS — M533 Sacrococcygeal disorders, not elsewhere classified: Secondary | ICD-10-CM | POA: Diagnosis not present

## 2021-12-06 NOTE — Patient Instructions (Addendum)
Good to see you Stop daily meloxicam can use remainder as needed for pain flares Tylenol [phone] mg 2-3 times a day for pain relief  Work note provided  May use topical Voltaren gel  4 week follow up

## 2022-01-09 NOTE — Progress Notes (Signed)
Miranda Holt [facility] [address] [place] [address] Phone: [phone]   Assessment and Plan:     1. Pain in sacrum 2. Coccydynia  -Chronic, mild improvement, subsequent visit - Consistent with coccydynia originating from sitting in an abnormal position during a 5-hour car ride in 02/2021.  Conservative therapy has been mildly beneficial which is included getting a new office chair, using coccyx/sacrum pillows, intermittent use of Tylenol/NSAIDs, however patient continues to have daily pain when sitting for prolonged periods of time - Recommend getting a standing desk to the patient can stand for at least 5 to 10 minutes/h - Encourage patient to continue on her weight loss journey which overall decreased pressure to this region while sitting - Discussed that we could trial CSI to coccyx, however this is uncomfortable and high risk area without guarantee of improvement.  Patient agrees to not proceed with injection at this time, but would consider at future visit if no improvement or worsening of symptoms -  Pertinent previous records reviewed include none   Follow Up: As needed if no improvement or worsening of symptoms.  Could further discuss injections versus advanced imaging   Subjective:   I, [name], am serving as a Neurosurgeon for Doctor Richardean Sale   Chief Complaint: left foot pain , coccydynia    HPI:    11/14/21 Patient is a 31 year old female complaining of left foot pain and coccydynia . Patient states She literally sits on the foot (pulls the left foot underneath her body to help offload her coccyx) and it gets progressively numb and she moves with some improvement but also with lingering sensation. Enjoys sitting that way and reduces tail bone pain which she has had since Sept 2022. Marland Kitchen Foot numbness started a couple of months ago do to the coccydynia pain , no MOI , was rx meloxicam  has started the meloxicam 7.5 mg  and hasn't done anything started Saturday    12/06/2021 Patient states that she is the same    01/17/2022 Patient states that her tailbone still hurts , got a new chair its great , but the damage is done , wants to know if she losses some weight if that would help    Relevant Historical Information: Elevated BMI  Additional pertinent review of systems negative.   Current Outpatient Medications:    escitalopram (LEXAPRO) 20 MG tablet, TAKE 1 TABLET(20 MG) BY MOUTH DAILY, Disp: 90 tablet, Rfl: 3   hydrOXYzine (ATARAX/VISTARIL) 25 MG tablet, Take 1 tablet (25 mg total) by mouth every 8 (eight) hours as needed for anxiety., Disp: 90 tablet, Rfl: 3   Levonorgestrel (KYLEENA) 19.5 MG IUD, Kyleena 17.5 mcg/24 hrs (37yrs) 19.5mg  intrauterine device  Take 1 device every day by intrauterine route., Disp: , Rfl:    meloxicam (MOBIC) 15 MG tablet, Take 1 tablet (15 mg total) by mouth daily., Disp: 21 tablet, Rfl: 0   meloxicam (MOBIC) 7.5 MG tablet, Take 1 tablet (7.5 mg total) by mouth daily., Disp: 10 tablet, Rfl: 0   Objective:     Vitals:   01/17/22 1548  BP: 110/81  Pulse: 61  SpO2: 96%  Weight: 271 lb (122.9 kg)  Height: 5\' 11"  (1.803 m)      Body mass index is 37.8 kg/m.    Physical Exam:    General: awake, alert, and oriented no acute distress, nontoxic Skin: no suspicious lesions or rashes Neuro:sensation intact distally with no dificits, normal  muscle tone, no atrophy, strength 5/5 in all tested lower ext groups Psych: normal mood and affect, speech clear   Bilateral hip/pelvis   No deformity, swelling or wasting Hip ROM Flexion 90, ext 30, IR 45, ER 45 TTP sacral base NTTP over the hip flexors, greater troch, glute musculature, si joint, lumbar spine Negative log roll with FROM Negative FABER Negative FADIR Negative Piriformis test   Gait normal    Electronically signed by:  Miranda Holt [facility] 4:09 PM 01/17/22

## 2022-01-10 ENCOUNTER — Ambulatory Visit: Payer: PRIVATE HEALTH INSURANCE | Admitting: Sports Medicine

## 2022-01-17 ENCOUNTER — Ambulatory Visit: Payer: PRIVATE HEALTH INSURANCE | Admitting: Sports Medicine

## 2022-01-17 VITALS — BP 110/81 | HR 61 | Ht 71.0 in | Wt 271.0 lb

## 2022-01-17 DIAGNOSIS — M533 Sacrococcygeal disorders, not elsewhere classified: Secondary | ICD-10-CM

## 2022-02-25 ENCOUNTER — Encounter: Payer: Self-pay | Admitting: *Deleted

## 2022-05-16 ENCOUNTER — Encounter: Payer: Self-pay | Admitting: Family Medicine

## 2022-05-16 ENCOUNTER — Ambulatory Visit (INDEPENDENT_AMBULATORY_CARE_PROVIDER_SITE_OTHER): Payer: PRIVATE HEALTH INSURANCE | Admitting: Family Medicine

## 2022-05-16 VITALS — BP 102/70 | HR 68 | Temp 98.1°F | Ht 71.0 in | Wt 273.2 lb

## 2022-05-16 DIAGNOSIS — F411 Generalized anxiety disorder: Secondary | ICD-10-CM | POA: Diagnosis not present

## 2022-05-16 DIAGNOSIS — Z0000 Encounter for general adult medical examination without abnormal findings: Secondary | ICD-10-CM | POA: Diagnosis not present

## 2022-05-16 NOTE — Patient Instructions (Addendum)
anxiety well controlled on lexapro 20 mg but worried about difficulty losing weight. We discussed changing to prozac 20 mg and titrating to 40mg  if needed after 6 weeks- she would like to go home and think about this and let know in the next week or so.  -if any thoughts of self harm seek care- [phone], [phone], or call us immediately  I like your idea of looking back into the training since that was so helpful- we could also refer to healthy weight to wellness program if you would like- medically supervised weight loss  Recommended follow up: Return in about 1 year (around 05/17/2023) for physical or sooner if needed.Schedule b4 you leave.  -if we change medicine need sooner follow up

## 2022-05-16 NOTE — Progress Notes (Addendum)
Phone [phone]   Subjective:  Patient presents today for their annual physical. Chief complaint-noted.   See problem oriented charting- ROS- full  review of systems was completed and negative except for: concern about weight gain  The following were reviewed and entered/updated in epic: Past Medical History:  Diagnosis Date   Migraines    since gradeschool- advil and hydrates   Seasonal allergies    claritin prn   Patient Active Problem List   Diagnosis Date Noted   GAD (generalized anxiety disorder) 06/05/2017    Priority: Medium    Migraines     Priority: Medium    Seasonal allergies     Priority: Low   Past Surgical History:  Procedure Laterality Date   WISDOM TOOTH EXTRACTION     not general anesthesia   Family History  Problem Relation Age of Onset   Arthritis Mother    Hearing loss Father        work related   Healthy Brother    Uterine cancer Maternal Grandmother        died in early 76s   Pancreatic cancer Maternal Grandfather        in late 38s   Hypertension Paternal Grandfather        died at 65   Healthy Brother    Colon cancer Maternal Uncle        in 47s    Medications- reviewed and updated Current Outpatient Medications  Medication Sig Dispense Refill   escitalopram (LEXAPRO) 20 MG tablet TAKE 1 TABLET(20 MG) BY MOUTH DAILY 90 tablet 3   hydrOXYzine (ATARAX/VISTARIL) 25 MG tablet Take 1 tablet (25 mg total) by mouth every 8 (eight) hours as needed for anxiety. 90 tablet 3   Levonorgestrel (KYLEENA) 19.5 MG IUD Kyleena 17.5 mcg/24 hrs (66yrs) 19.5mg  intrauterine device  Take 1 device every day by intrauterine route.     No current facility-administered medications for this visit.    Allergies-reviewed and updated No Known Allergies  Social History   Social History Narrative   Lives alone since sept 2021-brother with her in 2023. Parents still in [place].        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  Didn't want to do that for work.    Moved to [place] and worked at [employer] center for a while.    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       Hobbies: hiking, movies, video games- switch, ps5   Objective  Objective:  BP 102/70   Pulse 68   Temp 98.1 F (36.7 C)   Ht 5\' 11"  (1.803 m)   Wt 273 lb 3.2 oz (123.9 kg)   SpO2 98%   BMI 38.10 kg/m  Gen: NAD, resting comfortably HEENT: Mucous membranes are moist. Oropharynx normal Neck: no thyromegaly CV: RRR no murmurs rubs or gallops Lungs: CTAB no crackles, wheeze, rhonchi Abdomen: soft/nontender/nondistended/normal bowel sounds. No rebound or guarding.  Ext: no edema Skin: warm, dry Neuro: grossly normal, moves all extremities, PERRLA   Assessment and Plan   31 y.o. female presenting for annual physical.  Health Maintenance counseling: 1. Anticipatory guidance: Patient counseled regarding regular dental exams -q6 months, eye exams - yearly,  avoiding smoking and second hand smoke , limiting alcohol to 1 beverage per day- 2-4 per week , no illicit drugs .   2. Risk factor reduction:  Advised patient of need for  regular exercise and diet rich and fruits and vegetables to reduce risk of heart attack and stroke.  Exercise- has started walking regularly 4 days a week in last month- had been higher but got a kitten  Diet/weight management--mild weight gain from last visit-was referred to nutrition by GYN. Checked thyroid and normal. I like your idea of looking back into the training since that was so helpful- we could also refer to healthy weight to wellness program if you would like- medically supervised weight loss.  Wt Readings from Last 3 Encounters:  05/16/22 273 lb 3.2 oz (123.9 kg)  01/17/22 271 lb (122.9 kg)  12/06/21 268 lb (121.6 kg)  3. Immunizations/screenings/ancillary studies-declines flu and covid  Immunization History  Administered Date(s) Administered   Influenza-Unspecified  03/17/2018   Janssen (J&J) SARS-COV-2 Vaccination 09/11/2019   PFIZER(Purple Top)SARS-COV-2 Vaccination 05/31/2020   Tdap 06/05/2017   4. Cervical cancer screening- 12/01/20 WNL and hpv negative- plans to schedule follow up.  Follows with Dr. Philis Pique 5. Breast cancer screening-  breast exam with GYN and mammogram -likely yearly starting at 76 with no family history 6. Colon cancer screening - colonoscopy likely starting age 31-maternal uncle with colon cancer in 18s but will not move up this interval 7. Skin cancer screening- no dermatologist. advised regular sunscreen use. Denies worrisome, changing, or new skin lesions.  8. Birth control/STD check- Kyleena IUD in place. Declines std screening 9. Osteoporosis screening at 65-we will plan on this 10. Smoking associated screening -never smoker  Status of chronic or acute concerns   # Coccydynia-had referred to Dr. Glennon Mac in [place] several visits-he had really helped she could work towards a standing desk.  They ultimately opted out of CSI injection to the coccyx - she basically got a new chair and went away. Still sits in position that previously triggered but with more cushion no issues  # Anxiety/GAD S:Medication: Lexapro 20 mg, hydroxyzine available but not using. No SI -worried about weight gone Counseling: none A/P: anxiety well controlled on lexapro 20 mg but worried about difficulty losing weight. We discussed changing to prozac 20 mg and titrating to 40mg  if needed after 6 weeks- she would like to go home and think about this and let us know in the next week or so.    #hyperlipidemia S: Medication:none  Lab Results  Component Value Date   CHOL 226 (H) 12/07/2020   HDL 70.60 12/07/2020   LDLCALC 140 (H) 12/07/2020   TRIG 78.0 12/07/2020   CHOLHDL 3 12/07/2020   A/P: at her age would not use medicine- focus on lifestyle changes  Opted out of labs today  #sparing migraines- tolerated   Recommended follow up: Return in about  1 year (around 05/17/2023) for physical or sooner if needed.Schedule b4 you leave.  Lab/Order associations:NOT fasting   ICD-10-CM   1. Preventative health care  Z00.00       No orders of the defined types were placed in this encounter.   Return precautions advised.  Garret Reddish, MD

## 2022-06-05 ENCOUNTER — Telehealth: Payer: Self-pay | Admitting: Family Medicine

## 2022-06-05 NOTE — Telephone Encounter (Signed)
Pt decided to try changing Lexapro to Prozac per [facility] suggestion during last visit. Please advise.  Pharmacy:  [facility] [place], [place] - [address] Phone: [phone]  Fax: [phone]

## 2022-06-06 MED ORDER — FLUOXETINE HCL 20 MG PO CAPS: 20.0000 mg | ORAL_CAPSULE | Freq: Every day | ORAL | 5 refills | Status: DC

## 2022-06-06 NOTE — Telephone Encounter (Signed)
I sent Prozac in at 20 mg-have her start this and stop the Lexapro 20 mg.  Have her update me in 6 weeks with how she is feeling and we will consider going up to 40 mg dose.  Any thoughts of self-harm have her reach out immediately or call 911 or [phone] (very rare but possible side effect of medication)

## 2022-06-06 NOTE — Telephone Encounter (Signed)
From last OV: # Anxiety/GAD S:Medication: Lexapro 20 mg, hydroxyzine available but not using. No SI -worried about weight gone Counseling: none A/P: anxiety well controlled on lexapro 20 mg but worried about difficulty losing weight. We discussed changing to prozac 20 mg and titrating to 40mg  if needed after 6 weeks- she would like to go home and think about this and let us know in the next week or so.

## 2022-06-06 NOTE — Telephone Encounter (Signed)
Called and spoke with pt and below message given. 

## 2022-11-27 ENCOUNTER — Other Ambulatory Visit: Payer: Self-pay

## 2022-11-27 MED ORDER — FLUOXETINE HCL 20 MG PO CAPS: 20.0000 mg | ORAL_CAPSULE | Freq: Every day | ORAL | 5 refills | Status: DC

## 2023-04-07 ENCOUNTER — Encounter: Payer: Self-pay | Admitting: Family Medicine

## 2023-04-07 ENCOUNTER — Ambulatory Visit: Payer: No Typology Code available for payment source | Admitting: Family Medicine

## 2023-04-07 VITALS — BP 110/74 | HR 97 | Temp 97.8°F | Ht 71.0 in | Wt 276.0 lb

## 2023-04-07 DIAGNOSIS — R5383 Other fatigue: Secondary | ICD-10-CM | POA: Diagnosis not present

## 2023-04-07 DIAGNOSIS — J029 Acute pharyngitis, unspecified: Secondary | ICD-10-CM

## 2023-04-07 LAB — POCT INFLUENZA A/B
Influenza A, POC: NEGATIVE
Influenza B, POC: NEGATIVE

## 2023-04-07 LAB — POCT RAPID STREP A (OFFICE): Rapid Strep A Screen: NEGATIVE

## 2023-04-07 LAB — POC COVID19 BINAXNOW: SARS Coronavirus 2 Ag: NEGATIVE

## 2023-04-07 NOTE — Patient Instructions (Signed)
Please follow up if symptoms do not improve or as needed.    Pharyngitis  Pharyngitis is inflammation of the throat (pharynx). It is a very common cause of sore throat. Pharyngitis can be caused by a bacteria, but it is usually caused by a virus. Most cases of pharyngitis get better on their own without treatment. What are the causes? This condition may be caused by: Infection by viruses (viral). Viral pharyngitis spreads easily from person to person (is contagious) through coughing, sneezing, and sharing of personal items or utensils such as cups, forks, spoons, and toothbrushes. Infection by bacteria (bacterial). Bacterial pharyngitis may be spread by touching the nose or face after coming in contact with the bacteria, or through close contact, such as kissing. Allergies. Allergies can cause buildup of mucus in the throat (post-nasal drip), leading to inflammation and irritation. Allergies can also cause blocked nasal passages, forcing breathing through the mouth, which dries and irritates the throat. What increases the risk? You are more likely to develop this condition if: You are 38-52 years old. You are exposed to crowded environments such as daycare, school, or dormitory living. You live in a cold climate. You have a weakened disease-fighting (immune) system. What are the signs or symptoms? Symptoms of this condition vary by the cause. Common symptoms of this condition include: Sore throat. Fatigue. Low-grade fever. Stuffy nose (nasal congestion) and cough. Headache. Other symptoms may include: Glands in the neck (lymph nodes) that are swollen. Skin rashes. Plaque-like film on the throat or tonsils. This is often a symptom of bacterial pharyngitis. Vomiting. Red, itchy eyes (conjunctivitis). Loss of appetite. Joint pain and muscle aches. Enlarged tonsils. How is this diagnosed? This condition may be diagnosed based on your medical history and a physical exam. Your health care  provider will ask you questions about your illness and your symptoms. A swab of your throat may be done to check for bacteria (rapid strep test). Other lab tests may also be done, depending on the suspected cause, but these are rare. How is this treated? Many times, treatment is not needed for this condition. Pharyngitis usually gets better in 3-4 days without treatment. Bacterial pharyngitis may be treated with antibiotic medicines. Follow these instructions at home: Medicines Take over-the-counter and prescription medicines only as told by your health care provider. If you were prescribed an antibiotic medicine, take it as told by your health care provider. Do not stop taking the antibiotic even if you start to feel better. Use throat sprays to soothe your throat as told by your health care provider. Children can get pharyngitis. Do not give your child aspirin because of the association with Reye's syndrome. Managing pain To help with pain, try: Sipping warm liquids, such as broth, herbal tea, or warm water. Eating or drinking cold or frozen liquids, such as frozen ice pops. Gargling with a mixture of salt and water 3-4 times a day or as needed. To make salt water, completely dissolve -1 tsp (3-6 g) of salt in 1 cup (237 mL) of warm water. Sucking on hard candy or throat lozenges. Putting a cool-mist humidifier in your bedroom at night to moisten the air. Sitting in the bathroom with the door closed for 5-10 minutes while you run hot water in the shower.  General instructions  Do not use any products that contain nicotine or tobacco. These products include cigarettes, chewing tobacco, and vaping devices, such as e-cigarettes. If you need help quitting, ask your health care provider. Rest as told  by your health care provider. Drink enough fluid to keep your urine pale yellow. How is this prevented? To help prevent becoming infected or spreading infection: Wash your hands often with soap  and water for at least 20 seconds. If soap and water are not available, use hand sanitizer. Do not touch your eyes, nose, or mouth with unwashed hands, and wash hands after touching these areas. Do not share cups or eating utensils. Avoid close contact with people who are sick. Contact a health care provider if: You have large, tender lumps in your neck. You have a rash. You cough up green, yellow-brown, or bloody mucus. Get help right away if: Your neck becomes stiff. You drool or are unable to swallow liquids. You cannot drink or take medicines without vomiting. You have severe pain that does not go away, even after you take medicine. You have trouble breathing, and it is not caused by a stuffy nose. You have new pain and swelling in your joints such as the knees, ankles, wrists, or elbows. These symptoms may represent a serious problem that is an emergency. Do not wait to see if the symptoms will go away. Get medical help right away. Call your local emergency services (911 in the [place] Do not drive yourself to the hospital. Summary Pharyngitis is redness, pain, and swelling (inflammation) of the throat (pharynx). While pharyngitis can be caused by a bacteria, the most common causes are viral. Most cases of pharyngitis get better on their own without treatment. Bacterial pharyngitis is treated with antibiotic medicines. This information is not intended to replace advice given to you by your health care provider. Make sure you discuss any questions you have with your health care provider. Document Revised: 08/16/2020 Document Reviewed: 08/16/2020 Elsevier Patient Education  2024 [employer].

## 2023-04-07 NOTE — Progress Notes (Signed)
Subjective  CC:  Chief Complaint  Patient presents with   Sore Throat   Fatigue    HPI: Miranda Holt is a 32 y.o. female who presents to the office today to address the problems listed above in the chief complaint. C/o sore throat without fever. Had headache as well but relieved with advil. Started 2 days ago; brother with same. Feeling a bit bette today.  No SOB or GI sxs. No known exposure ot strep or mono but was out with many people at haunted house event. OTC analgesics have been used with minimal or mild relief. Eating and drinking OK.   I reviewed the patients updated PMH, FH, and SocHx.    Patient Active Problem List   Diagnosis Date Noted   GAD (generalized anxiety disorder) 06/05/2017   Seasonal allergies    Migraines    Current Meds  Medication Sig   FLUoxetine (PROZAC) 20 MG capsule Take 1 capsule (20 mg total) by mouth daily.   Levonorgestrel (KYLEENA) 19.5 MG IUD Kyleena 17.5 mcg/24 hrs (34yrs) 19.5mg  intrauterine device  Take 1 device every day by intrauterine route.    Allergies: Patient has No Known Allergies.  Review of Systems: Constitutional: Negative for fever malaise or anorexia Cardiovascular: negative for chest pain Respiratory: negative for SOB or persistent cough Gastrointestinal: negative for abdominal pain  Objective  Vitals: BP 110/74   Pulse 97   Temp 97.8 F (36.6 C)   Ht 5\' 11"  (1.803 m)   Wt 276 lb (125.2 kg)   SpO2 96%   BMI 38.49 kg/m  General: no acute distress , A&Ox3 HEENT: PEERL, conjunctiva normal, bilateral EAC and TMs are normal. Nares normal. Oropharynx moist with erythematous posterior pharynx without exudate, + cervical LAD, 2+ tonsils, midline uvula, neck is supple Cardiovascular:  RRR without murmur or gallop.  Respiratory:  Good breath sounds bilaterally, CTAB with normal respiratory effort Skin:  Warm, no rashes  Office Visit on 04/07/2023  Component Date Value Ref Range Status   SARS Coronavirus 2 Ag 04/07/2023  Negative  Negative Final   Influenza A, POC 04/07/2023 Negative  Negative Final   Influenza B, POC 04/07/2023 Negative  Negative Final   Rapid Strep A Screen 04/07/2023 Negative  Negative Final    Assessment  1. Viral pharyngitis   2. Sore throat   3. Other fatigue      Plan  Supportive care with advil, tylenol, gargles etc discussed. . RTO if sxs persist or worsen.   Follow up: as needed  Commons side effects, risks, benefits, and alternatives for medications and treatment plan prescribed today were discussed, and the patient expressed understanding of the given instructions. Patient is instructed to call or message via MyChart if he/she has any questions or concerns regarding our treatment plan. No barriers to understanding were identified. We discussed Red Flag symptoms and signs in detail. Patient expressed understanding regarding what to do in case of urgent or emergency type symptoms.  Medication list was reconciled, printed and provided to the patient in AVS. Patient instructions and summary information was reviewed with the patient as documented in the AVS. This note was prepared with assistance of Dragon voice recognition software. Occasional wrong-word or sound-a-like substitutions may have occurred due to the inherent limitations of voice recognition software  Orders Placed This Encounter  Procedures   POC COVID-19   POCT Influenza A/B   POCT rapid strep A   No orders of the defined types were placed in this encounter.

## 2023-05-19 ENCOUNTER — Encounter: Payer: Self-pay | Admitting: Family Medicine

## 2023-05-19 ENCOUNTER — Ambulatory Visit (INDEPENDENT_AMBULATORY_CARE_PROVIDER_SITE_OTHER): Payer: No Typology Code available for payment source | Admitting: Family Medicine

## 2023-05-19 VITALS — BP 120/80 | HR 67 | Temp 97.8°F | Ht 71.0 in | Wt 263.8 lb

## 2023-05-19 DIAGNOSIS — Z13220 Encounter for screening for lipoid disorders: Secondary | ICD-10-CM | POA: Diagnosis not present

## 2023-05-19 DIAGNOSIS — Z130 Encounter for screening for diseases of the blood and blood-forming organs and certain disorders involving the immune mechanism: Secondary | ICD-10-CM | POA: Diagnosis not present

## 2023-05-19 DIAGNOSIS — Z0000 Encounter for general adult medical examination without abnormal findings: Secondary | ICD-10-CM | POA: Diagnosis not present

## 2023-05-19 MED ORDER — FLUOXETINE HCL 20 MG PO CAPS: 20.0000 mg | ORAL_CAPSULE | Freq: Every day | ORAL | 3 refills | Status: DC

## 2023-05-19 NOTE — Progress Notes (Signed)
Phone [phone]   Subjective:  Patient presents today for their annual physical. Chief complaint-noted.   See problem oriented charting- ROS- full  review of systems was completed and negative Per full ROS sheet completed by patient  The following were reviewed and entered/updated in epic: Past Medical History:  Diagnosis Date   Allergy    Seasonal   Anxiety    Migraines    since gradeschool- advil and hydrates   Seasonal allergies    claritin prn   Patient Active Problem List   Diagnosis Date Noted   GAD (generalized anxiety disorder) 06/05/2017    Priority: Medium    Migraines     Priority: Medium    Seasonal allergies     Priority: Low   Past Surgical History:  Procedure Laterality Date   WISDOM TOOTH EXTRACTION     not general anesthesia    Family History  Problem Relation Age of Onset   Arthritis Mother    Hearing loss Father        work related   Healthy Brother    Healthy Brother    Uterine cancer Maternal Grandmother        died in early 26s   Cancer Maternal Grandmother    Pancreatic cancer Maternal Grandfather        in late 35s   Cancer Maternal Grandfather    Hypertension Paternal Grandfather        died at 13   Colon cancer Maternal Uncle        in 78s   Cancer Maternal Uncle    Colon cancer Paternal Aunt        age 62    Medications- reviewed and updated Current Outpatient Medications  Medication Sig Dispense Refill   Levonorgestrel (KYLEENA) 19.5 MG IUD Kyleena 17.5 mcg/24 hrs (29yrs) 19.5mg  intrauterine device  Take 1 device every day by intrauterine route.     FLUoxetine (PROZAC) 20 MG capsule Take 1 capsule (20 mg total) by mouth daily. 90 capsule 3   No current facility-administered medications for this visit.    Allergies-reviewed and updated No Known Allergies  Social History   Social History Narrative   Lives alone since sept 2021-brother with her in 2023. Parents still in [place].        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  Didn't want to do that for work.    Moved to [place] and worked at [employer] center for a while.    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       Hobbies: hiking, movies, video games- switch, ps5   Objective  Objective:  BP 120/80   Pulse 67   Temp 97.8 F (36.6 C)   Ht 5\' 11"  (1.803 m)   Wt 263 lb 12.8 oz (119.7 kg)   SpO2 96%   BMI 36.79 kg/m  Gen: NAD, resting comfortably HEENT: Mucous membranes are moist. Oropharynx normal Neck: no thyromegaly CV: RRR no murmurs rubs or gallops Lungs: CTAB no crackles, wheeze, rhonchi Abdomen: soft/nontender/nondistended/normal bowel sounds. No rebound or guarding.  Ext: no edema Skin: warm, dry Neuro: grossly normal, moves all extremities, PERRLA   Assessment and Plan   32 y.o. female presenting for annual physical.  Health Maintenance counseling: 1. Anticipatory guidance: Patient counseled regarding regular dental exams -q6 months, eye exams - yearly,  avoiding smoking and second hand smoke , limiting alcohol to 1 beverage  per day- 2 a week , no illicit drugs .   2. Risk factor reduction:  Advised patient of need for regular exercise and diet rich and fruits and vegetables to reduce risk of heart attack and stroke.  Exercise-last year was walking 4 days a week- going to gym instead lately- treadmill, ellliptical, rower- considering adding weights.  Diet/weight management-weight down 10 pounds from last year-congratulated her efforts- doing calorie counting- stays under her goals she counted starting with myfitnesspal. Started this in the last month plus was sick and lost some weight Wt Readings from Last 3 Encounters:  05/19/23 263 lb 12.8 oz (119.7 kg)  04/07/23 276 lb (125.2 kg)  05/16/22 273 lb 3.2 oz (123.9 kg)  3. Immunizations/screenings/ancillary studies-declines flu and COVID shot  Immunization History  Administered Date(s) Administered    Influenza-Unspecified 03/17/2018   Janssen (J&J) SARS-COV-2 Vaccination 09/11/2019   PFIZER(Purple Top)SARS-COV-2 Vaccination 05/31/2020   Tdap 06/05/2017   4. Cervical cancer screening- 12/01/20 WNL and hpv negative- plans to schedule follow up but could wait up to 5 years .  Follows with Dr. Henderson Cloud 5. Breast cancer screening-  breast exam with GYN and mammogram -likely yearly starting at 40 with no family history  6. Colon cancer screening - colonoscopy likely starting age 75-paternal aunt with colon cancer at 1- not first degree relative but we could still consider referral at 57.  7. Skin cancer screening- no dermatologist. advised regular sunscreen use. Denies worrisome, changing, or new skin lesions.  8. Birth control/STD check- Kyleena IUD in place. Declines std screening 9. Osteoporosis screening at 65-we will plan on this 10. 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    # GAD S:Medication: Fluoxetine 20 mg (off of Lexapro for possible weight gain), hydroxyzine as needed- not needing lately    05/19/2023    2:59 PM 04/07/2023   10:47 AM 12/07/2020    4:30 PM 05/18/2020    4:32 PM  GAD 7 : Generalized Anxiety Score  Nervous, Anxious, on Edge 0 0 0 1  Control/stop worrying 0 0 0 1  Worry too much - different things 0 0 0 3  Trouble relaxing 0 0 0 1  Restless 0 0 0 1  Easily annoyed or irritable 0 0 0 1  Afraid - awful might happen 0 0 0 1  Total GAD 7 Score 0 0 0 9  Anxiety Difficulty Not difficult at all Not difficult at all Not difficult at all Not difficult at all  A/P: generalized anxiety disorder full remisison- continue current medications    - knows if changed pregnancy plan to let us know  # Screening for hyperlipidemia-update lipid panel with labs along with CBC and CMP for screening purposes  Lab Results  Component Value Date   CHOL 226 (H) 12/07/2020   HDL 70.60  12/07/2020   LDLCALC 140 (H) 12/07/2020   TRIG 78.0 12/07/2020   CHOLHDL 3 12/07/2020   # Sparing migraines-no recent migraines (no aura)  but some headache(s)    Recommended follow up: Return in about 1 year (around 05/18/2024) for physical or sooner if needed.Schedule b4 you leave.  Lab/Order associations: fasting   ICD-10-CM   1. Preventative health care  Z00.00     2. Screening for hyperlipidemia  Z13.220 Comprehensive metabolic panel    Lipid panel    3. Screening for deficiency anemia  Z13.0 CBC with Differential/Platelet  Meds ordered this encounter  Medications   FLUoxetine (PROZAC) 20 MG capsule    Sig: Take 1 capsule (20 mg total) by mouth daily.    Dispense:  90 capsule    Refill:  3    Return precautions advised.  Tana Conch, MD

## 2023-05-19 NOTE — Patient Instructions (Addendum)
Please stop by lab before you go If you have mychart- we will send your results within 3 business days of [place] receiving them.  If you do not have mychart- we will call you about results within 5 business days of [place] receiving them.  *please also note that you will see labs on mychart as soon as they post. I will later go in and write notes on them- will say "notes from Dr. Durene Cal"   Recommended follow up: Return in about 1 year (around 05/18/2024) for physical or sooner if needed.Schedule b4 you leave.

## 2023-05-20 LAB — CBC WITH DIFFERENTIAL/PLATELET
Basophils Absolute: 0.1 10*3/uL (ref 0.0–0.1)
Basophils Relative: 0.8 % (ref 0.0–3.0)
Eosinophils Absolute: 0.2 10*3/uL (ref 0.0–0.7)
Eosinophils Relative: 2.6 % (ref 0.0–5.0)
HCT: 43.8 % (ref 36.0–46.0)
Hemoglobin: 14.5 g/dL (ref 12.0–15.0)
Lymphocytes Relative: 35.9 % (ref 12.0–46.0)
Lymphs Abs: 3.1 10*3/uL (ref 0.7–4.0)
MCHC: 33.1 g/dL (ref 30.0–36.0)
MCV: 95.5 fL (ref 78.0–100.0)
Monocytes Absolute: 0.4 10*3/uL (ref 0.1–1.0)
Monocytes Relative: 5.1 % (ref 3.0–12.0)
Neutro Abs: 4.7 10*3/uL (ref 1.4–7.7)
Neutrophils Relative %: 55.6 % (ref 43.0–77.0)
Platelets: 373 10*3/uL (ref 150.0–400.0)
RBC: 4.58 Mil/uL (ref 3.87–5.11)
RDW: 13.2 % (ref 11.5–15.5)
WBC: 8.5 10*3/uL (ref 4.0–10.5)

## 2023-05-20 LAB — LIPID PANEL
Cholesterol: 205 mg/dL — ABNORMAL HIGH (ref 0–200)
HDL: 55.7 mg/dL (ref >39.00)
LDL Cholesterol: 124 mg/dL — ABNORMAL HIGH (ref 0–99)
NonHDL: 149.58
Total CHOL/HDL Ratio: 4
Triglycerides: 129 mg/dL (ref 0.0–149.0)
VLDL: 25.8 mg/dL (ref 0.0–40.0)

## 2023-05-20 LAB — COMPREHENSIVE METABOLIC PANEL
ALT: 10 U/L (ref 0–35)
AST: 12 U/L (ref 0–37)
Albumin: 4.4 g/dL (ref 3.5–5.2)
Alkaline Phosphatase: 59 U/L (ref 39–117)
BUN: 13 mg/dL (ref 6–23)
CO2: 27 meq/L (ref 19–32)
Calcium: 9.4 mg/dL (ref 8.4–10.5)
Chloride: 103 meq/L (ref 96–112)
Creatinine, Ser: 0.77 mg/dL (ref 0.40–1.20)
GFR: 100.82 mL/min (ref >60.00)
Glucose, Bld: 78 mg/dL (ref 70–99)
Potassium: 4.1 meq/L (ref 3.5–5.1)
Sodium: 138 meq/L (ref 135–145)
Total Bilirubin: 0.5 mg/dL (ref 0.2–1.2)
Total Protein: 7.3 g/dL (ref 6.0–8.3)

## 2023-07-08 ENCOUNTER — Ambulatory Visit: Payer: No Typology Code available for payment source | Admitting: Family Medicine

## 2023-07-08 ENCOUNTER — Encounter: Payer: Self-pay | Admitting: Family Medicine

## 2023-07-08 VITALS — BP 110/75 | HR 97 | Temp 98.1°F | Ht 71.0 in

## 2023-07-08 DIAGNOSIS — J029 Acute pharyngitis, unspecified: Secondary | ICD-10-CM | POA: Diagnosis not present

## 2023-07-08 LAB — POCT INFLUENZA A/B
Influenza A, POC: NEGATIVE
Influenza B, POC: NEGATIVE

## 2023-07-08 LAB — POC COVID19 BINAXNOW: SARS Coronavirus 2 Ag: NEGATIVE

## 2023-07-08 LAB — POCT RAPID STREP A (OFFICE): Rapid Strep A Screen: POSITIVE — AB

## 2023-07-08 MED ORDER — AMOXICILLIN 500 MG PO CAPS: 500.0000 mg | ORAL_CAPSULE | Freq: Three times a day (TID) | ORAL | 0 refills | Status: AC

## 2023-07-08 NOTE — Progress Notes (Signed)
   Miranda Holt is a 33 y.o. female who presents today for an office visit.  Assessment/Plan:  Sore Throat  Rapid strep positive.  Flu and COVID-negative.  Will treat with course of amoxicillin .  Encouraged hydration.  She can use over-the-counter meds as needed.  We discussed reasons to return to care.  Follow-up as needed.     Subjective:  HPI:  See Assessment / plan for status of chronic conditions.  Patient is here with sore throat, congestion, and headache for the last 3 days. Within the last few days she has had some left ear pain and jaw pain. No fevers or chills. Some nausea. No vomiting. No cough or shortness of breath.        Objective:  Physical Exam: BP 110/75   Pulse 97   Temp 98.1 F (36.7 C) (Temporal)   Ht 5' 11 (1.803 m)   SpO2 95%   BMI 36.79 kg/m   Gen: No acute distress, resting comfortably HEENT: TMs clear.  OP erythematous.  Nasal mucosa erythematous and boggy bilaterally with thick discharge. CV: Regular rate and rhythm with no murmurs appreciated Pulm: Normal work of breathing, clear to auscultation bilaterally with no crackles, wheezes, or rhonchi Neuro: Grossly normal, moves all extremities Psych: Normal affect and thought content      Quame Spratlin M. Kennyth, MD 07/08/2023 10:03 AM

## 2023-07-08 NOTE — Patient Instructions (Addendum)
 It was very nice to see you today!  Your strep test is positive.  Please start the amoxicillin .  Return if symptoms worsen or fail to improve.   Take care, Dr Kennyth  PLEASE NOTE:  If you had any lab tests, please let us  know if you have not heard back within a few days. You may see your results on mychart before we have a chance to review them but we will give you a call once they are reviewed by us .   If we ordered any referrals today, please let us  know if you have not heard from their office within the next week.   If you had any urgent prescriptions sent in today, please check with the pharmacy within an hour of our visit to make sure the prescription was transmitted appropriately.   Please try these tips to maintain a healthy lifestyle:  Eat at least 3 REAL meals and 1-2 snacks per day.  Aim for no more than 5 hours between eating.  If you eat breakfast, please do so within one hour of getting up.   Each meal should contain half fruits/vegetables, one quarter protein, and one quarter carbs (no bigger than a computer mouse)  Cut down on sweet beverages. This includes juice, soda, and sweet tea.   Drink at least 1 glass of water with each meal and aim for at least 8 glasses per day  Exercise at least 150 minutes every week.

## 2023-08-01 ENCOUNTER — Ambulatory Visit: Payer: No Typology Code available for payment source | Admitting: Family Medicine

## 2023-08-08 ENCOUNTER — Telehealth: Payer: No Typology Code available for payment source | Admitting: Family Medicine

## 2023-08-08 ENCOUNTER — Encounter: Payer: Self-pay | Admitting: Family Medicine

## 2023-08-08 VITALS — Ht 71.0 in

## 2023-08-08 DIAGNOSIS — G44209 Tension-type headache, unspecified, not intractable: Secondary | ICD-10-CM

## 2023-08-08 DIAGNOSIS — G43009 Migraine without aura, not intractable, without status migrainosus: Secondary | ICD-10-CM | POA: Diagnosis not present

## 2023-08-08 MED ORDER — PREDNISONE 20 MG PO TABS: ORAL_TABLET | ORAL | 0 refills | Status: DC

## 2023-08-08 NOTE — Progress Notes (Signed)
 Phone [phone] Virtual visit via Video note   Subjective:  Chief complaint: Chief Complaint  Patient presents with   Headache    Pt c/o constant headaches at the base of her skull and moves around. Pain level 6.    Our team/I connected with Cline Cools at  4:00 PM EST by a video enabled telemedicine application (caregility through epic) and verified that I am speaking with the correct person using two identifiers. Our team/I discussed the limitations of evaluation and management by telemedicine and the availability of in person appointments.No physical exam was performed (except for noted visual exam or audio findings with Telehealth visits).   Location patient: [facility] Location provider: [facility], office Persons participating in the virtual visit:  patient  Past Medical History-  Patient Active Problem List   Diagnosis Date Noted   GAD (generalized anxiety disorder) 06/05/2017    Priority: Medium    Migraines     Priority: Medium    Seasonal allergies     Priority: Low    Medications- reviewed and updated Current Outpatient Medications  Medication Sig Dispense Refill   FLUoxetine (PROZAC) 20 MG capsule Take 1 capsule (20 mg total) by mouth daily. 90 capsule 3   Levonorgestrel (KYLEENA) 19.5 MG IUD Kyleena 17.5 mcg/24 hrs (45yrs) 19.5mg  intrauterine device  Take 1 device every day by intrauterine route.     predniSONE (DELTASONE) 20 MG tablet Take 1 tablet by mouth daily for 5 days, then 1/2 tablet daily for 2 days 6 tablet 0   No current facility-administered medications for this visit.     Objective:  Ht 5\' 11"  (1.803 m)   BMI 36.79 kg/m  self reported vitals Gen: NAD, resting comfortably Lungs: nonlabored, normal respiratory rate  Skin: appears dry, no obvious rash     Assessment and Plan   # Migraines/headaches S: Patient with history of migraines since grade school-Advil and hydration typically helpful.  Most recently starting last Monday (thought it  was the weather as tends to happen with change in weather) she complains of nearly constant headaches at the base of her skull that also tend to move around that lasted around 4 days into last Thursday (peak of pain-shaky, nausea, pins and needles in hands then felt better)- advil would help for a few hours then would come back.  Neck range of motion still normal- pressure in neck does feel tight.   - at the moment feels a slight squeezing at base of skull and pressure behind left eye. Pain came back last night and has persisted into today- has not taken advil yet. Gets nauseous with it.  -has kyleena and not active so not pregnancy - more severe than usual tension headaches and feels different than her migraines -has increased water intake -usually takes 3 advil  No facial or extremity weakness. No slurred words or trouble swallowing. no blurry vision or double vision. No paresthesias. No confusion or word finding difficulties.   A/P: This seems to be a flare of more intense tension headaches and we opted to have her do ibuprofen 600mg  (3 of the 200 mg pills) and then repeat the same in 6 hours if still having issues and then try to get some rest. If symptoms recur tomorrow or next few days we may try prednisone that I'm sending in to break her out of this cycle. If that doesn't work or if she has new or worsening symptoms could refer to neurology -Unfortunately my prior advice to limit ibuprofen  to twice a week has led her to try to not take it almost at all unless headache is severe-discussed with headaches whether migraine or tension generally more effective if takes earlier in the course but I would still try to limit to 2 days a week if possible but on a bad week could potentially take more -She does think at the peak of her pain she did have a migraine last week but it does not seem to be at the point where we would want to put her on migraine preventative medicine  Earlier in the day I had  asked my team to call patient to see if she could come in for an in person visit instead of virtual and my team reported sending this message to the front desk but apparently they were unable to connect-discussed with patient ideally if this does not resolve would need to see her in person to do full neurological exam.  We discussed if any abnormalities may need neuroimaging   Recommended follow up: Return for as needed for new, worsening, persistent symptoms. Future Appointments  Date Time Provider Department Center  05/20/2024  3:00 PM Shelva Majestic, MD [facility]    Lab/Order associations:   ICD-10-CM   1. Tension headache  G44.209     2. Migraine without aura and without status migrainosus, not intractable  G43.009       Meds ordered this encounter  Medications   predniSONE (DELTASONE) 20 MG tablet    Sig: Take 1 tablet by mouth daily for 5 days, then 1/2 tablet daily for 2 days    Dispense:  6 tablet    Refill:  0    Return precautions advised.  Tana Conch, MD

## 2023-08-08 NOTE — Patient Instructions (Addendum)
 This seems to be a flare of more intense tension headaches and we opted to have her do ibuprofen 600mg  (3 of the 200 mg pills) and then repeat the same in 6 hours if still having issues and then try to get some rest. If symptoms recur tomorrow or next few days we may try prednisone that I'm sending in to break her out of this cycle. If that doesn't work or if she has new or worsening symptoms could refer to neurology  Recommended follow up: Return for as needed for new, worsening, persistent symptoms.

## 2023-12-27 IMAGING — DX DG SACRUM/COCCYX 2+V
3 series · 3 of 3 positions shown · non-contrast
Comparison: None Available.

CLINICAL DATA: Tibial pain since [DATE].

EXAM:
SACRUM AND COCCYX - 2+ VIEW

[sacrum ap]
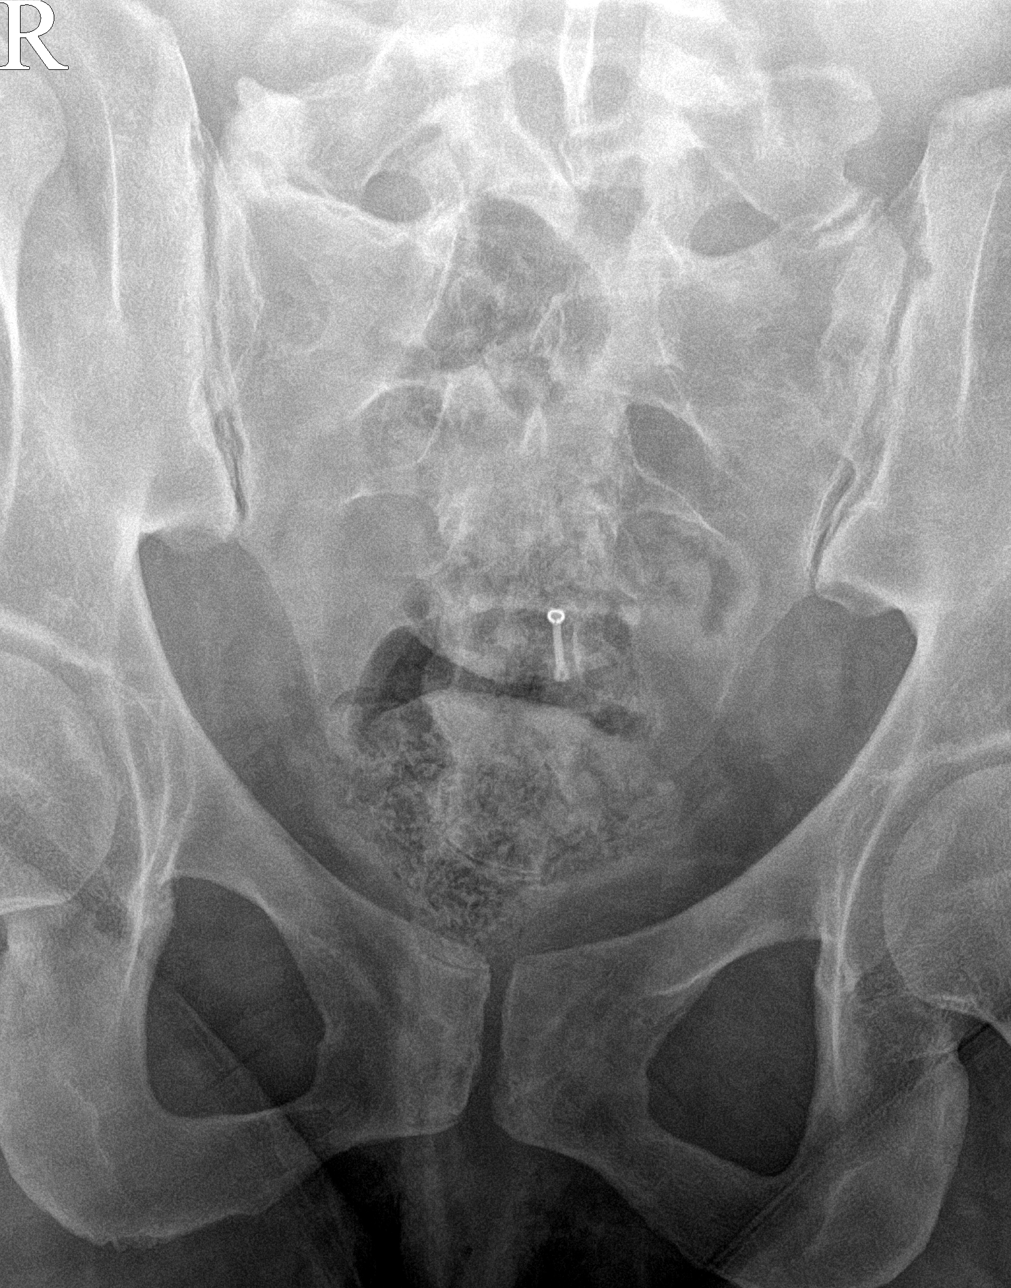

[coccyx ap]
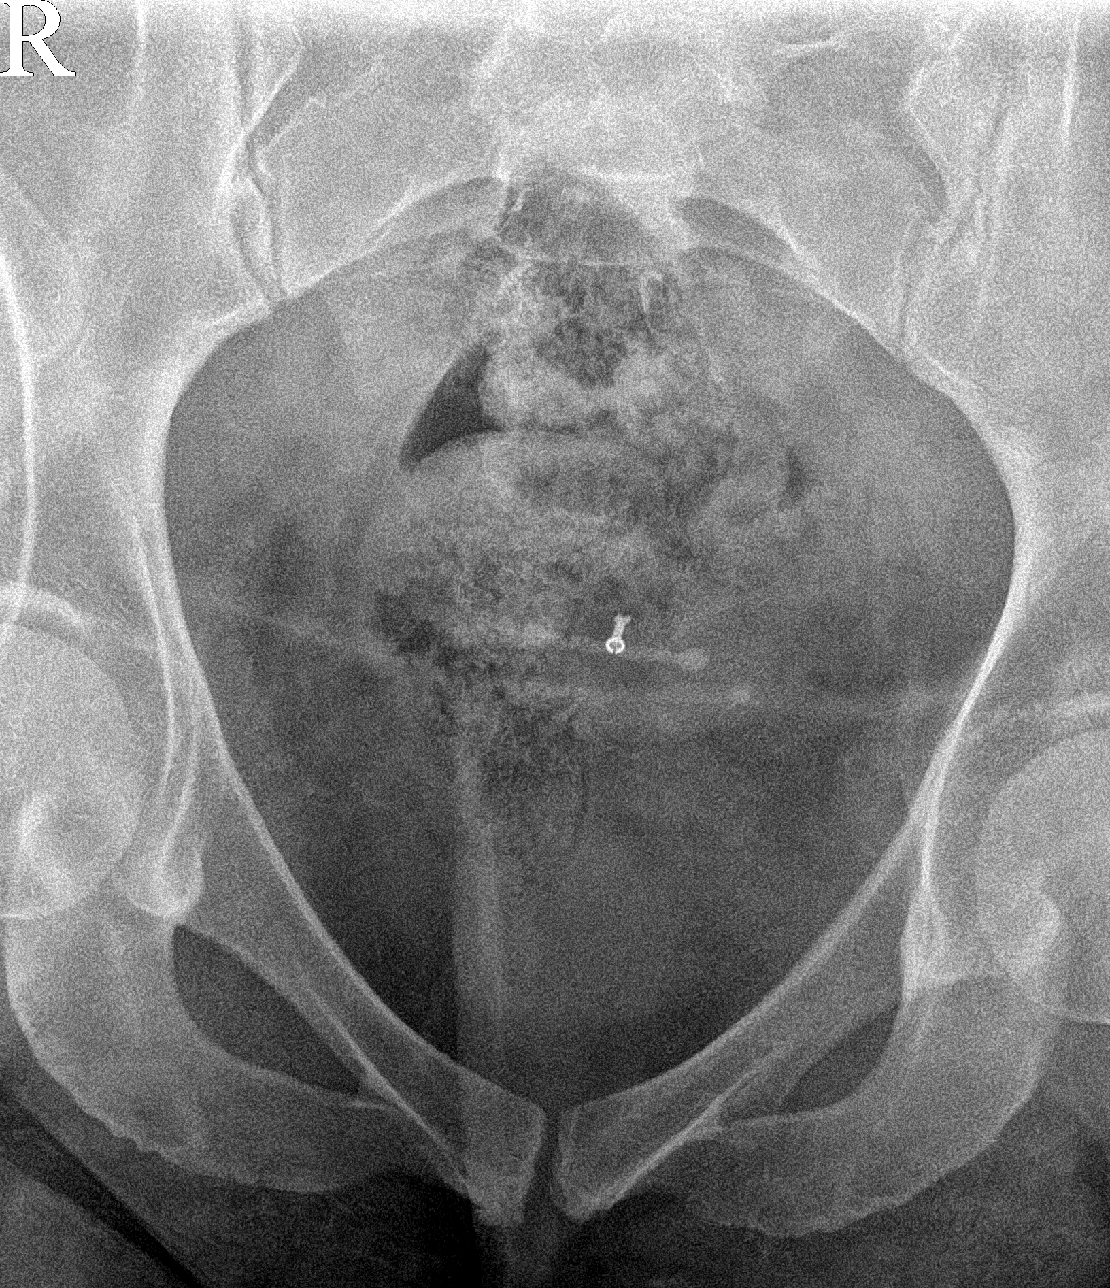

[sacrum lat]
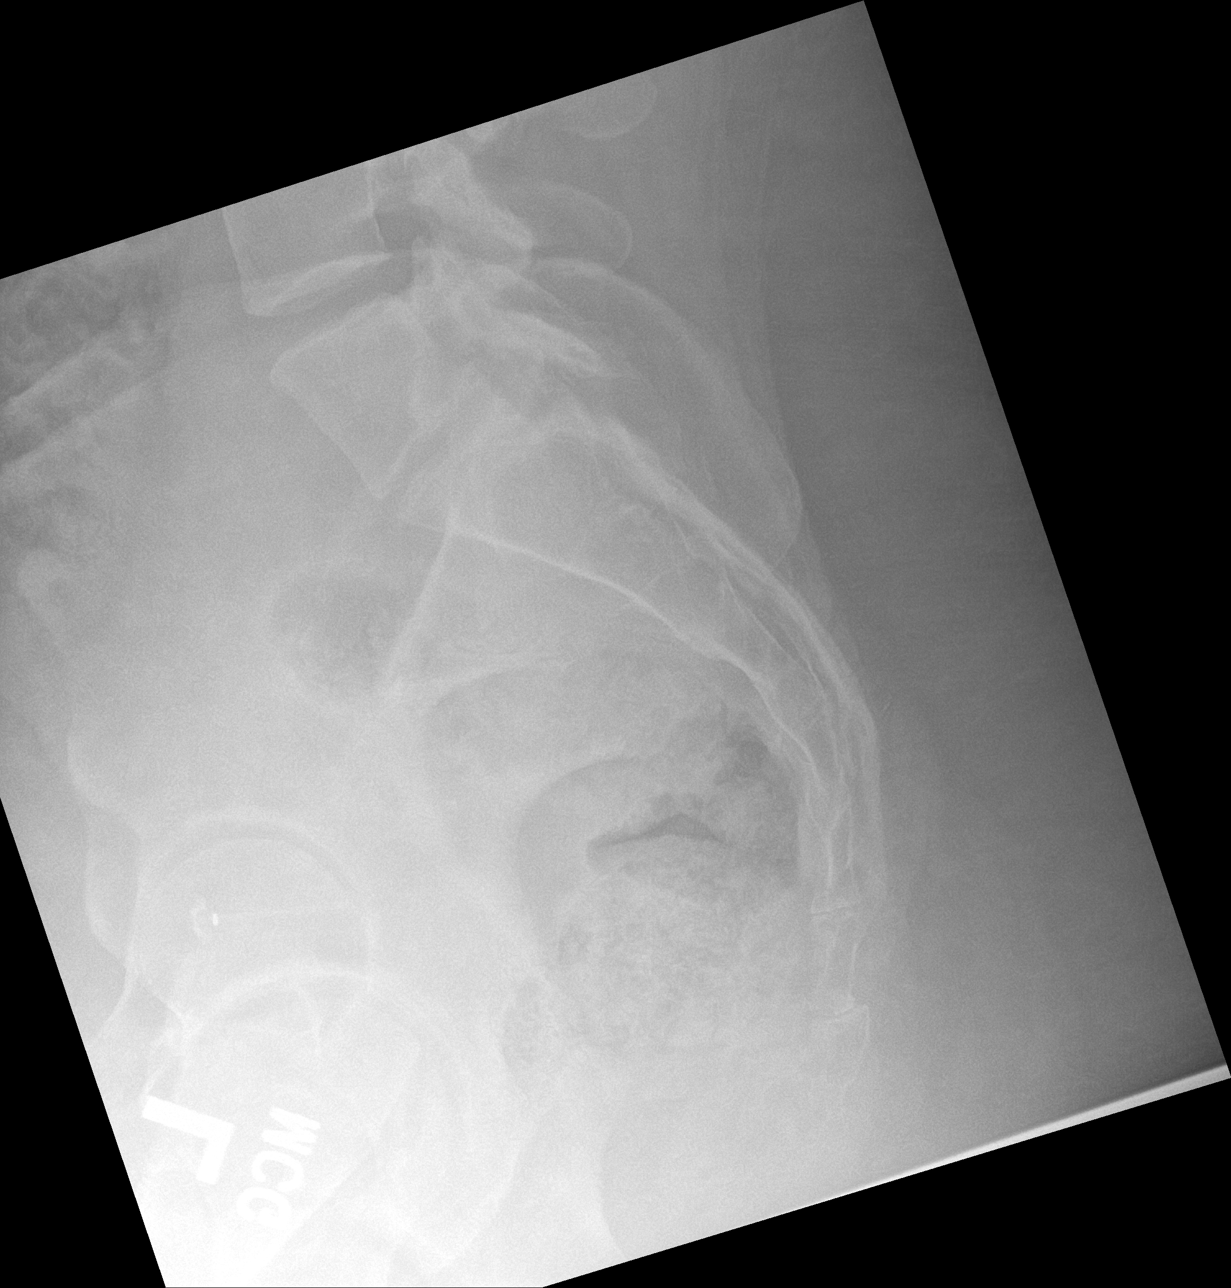

[3 of 3 positions shown; findings below may reference images not displayed]

FINDINGS: Bowel gas and stool obscure portions of the mid to inferior sacrum
on frontal view. There is a small metallic device overlying the
midline pelvis on frontal view, indeterminate for possible overlying
clothing or a device inside the patient. On lateral view there may
be some metallic density overlying the femoral heads, however this
is not definitely visualized.

The bilateral sacroiliac joint spaces are maintained. Minimal pubic
symphysis osteoarthritis.

There are bilateral lumbosacral transitional assimilation joints
with mild associated osteoarthritis.

No acute fracture or dislocation is seen within the sacrum or coccyx
on lateral view.
IMPRESSION: 1. No acute fracture is seen.
2. There are bilateral lumbosacral transitional assimilation joints
with mild associated osteoarthritis. These may be associated with
lower back and/or hip pain.

## 2024-01-18 ENCOUNTER — Telehealth: Admitting: Nurse Practitioner

## 2024-01-18 DIAGNOSIS — L308 Other specified dermatitis: Secondary | ICD-10-CM | POA: Diagnosis not present

## 2024-01-18 MED ORDER — HYDROCORTISONE 2.5 % EX CREA: TOPICAL_CREAM | Freq: Two times a day (BID) | CUTANEOUS | 0 refills | Status: AC

## 2024-01-18 NOTE — Progress Notes (Signed)
 I have spent 5 minutes in review of e-visit questionnaire, review and updating patient chart, medical decision making and response to patient.   Claiborne Rigg, NP

## 2024-01-18 NOTE — Progress Notes (Signed)

## 2024-05-14 ENCOUNTER — Encounter: Payer: Self-pay | Admitting: Family Medicine

## 2024-05-14 ENCOUNTER — Ambulatory Visit: Admitting: Family Medicine

## 2024-05-14 VITALS — BP 112/82 | HR 69 | Temp 98.5°F | Ht 71.0 in

## 2024-05-14 DIAGNOSIS — H0100B Unspecified blepharitis left eye, upper and lower eyelids: Secondary | ICD-10-CM

## 2024-05-14 DIAGNOSIS — H0100A Unspecified blepharitis right eye, upper and lower eyelids: Secondary | ICD-10-CM | POA: Diagnosis not present

## 2024-05-14 NOTE — Progress Notes (Signed)
 Phone [phone] In person visit   Subjective:   Miranda Holt is a 33 y.o. year old very pleasant female patient who presents for/with See problem oriented charting Chief Complaint  Patient presents with   Belepharitis    Red, itchy dry eye lids that started in July. Has tried several OTC creams- hydrocortisone  cream, aloe vera and even chap stick.  Has also tried benadryl.      Past Medical History-  Patient Active Problem List   Diagnosis Date Noted   GAD (generalized anxiety disorder) 06/05/2017    Priority: Medium    Migraines     Priority: Medium    Seasonal allergies     Priority: Low    Medications- reviewed and updated Current Outpatient Medications  Medication Sig Dispense Refill   FLUoxetine  (PROZAC ) 20 MG capsule Take 1 capsule (20 mg total) by mouth daily. 90 capsule 3   hydrocortisone  2.5 % cream Apply topically 2 (two) times daily. (Patient taking differently: Apply topically as needed.) 30 g 0   Levonorgestrel (KYLEENA) 19.5 MG IUD Kyleena 17.5 mcg/24 hrs (72yrs) 19.5mg  intrauterine device  Take 1 device every day by intrauterine route.     No current facility-administered medications for this visit.     Objective:  BP 112/82 (BP Location: Right Arm, Patient Position: Sitting, Cuff Size: Normal)   Pulse 69   Temp 98.5 F (36.9 C) (Temporal)   Ht 5' 11 (1.803 m)   SpO2 96%   BMI 36.79 kg/m  Gen: NAD, resting comfortably She shows me pictures of bilateral eyelids erythematous with mild scale upper and lower eyelids but primarily on the upper lid.  No obvious swelling - Mild erythema noted behind her cover up on exam today CV: RRR no murmurs rubs or gallops Lungs: CTAB no crackles, wheeze, rhonchi Ext: no edema Skin: warm, dry, other areas of skin appear normal     Assessment and Plan   # Blepharitis upper and lower lid S: Patient reports red itchy dry eyelids starting in July.  She tried over-the-counter creams, hydrocortisone  1 and 2.5%- helps  short term, aloe vera and even Chapstick without relief.  Has tried Benadryl for the itch. -she uses same makeup she has been using (she's not sure she can tolerate this). Uses a cover up in that area and then uses same mascara- nothing has changes and same eyeliner.  -she has recently seen optho but did not mention this. In the mornings this can be puffy.  Has not tried warm compresses -did start after make up wipes but stopped them in July with no relief -uses a plain cotton round with micelar water- in past used just hands- she may return to using her hands  ROS-not ill appearing, no fever/chills. No new medications. Not immunocompromised. No mucus membrane involvement.  A/P: This appears to be blepharitis and I discussed that I thought her make up including eyeliner, mascara, cover up could be contributing and I asked her to discontinue this but she thinks this will be very challenging -She is agreeable to try conservative measures that we discussed including twice daily warm compresses, twice daily lid massage, twice daily lid washing and consideration of artificial tears.  She was to try to be aggressive so we also gave her some bacitracin to try before bed and she can use if needed during the daytime as well. -Discussed referral to dermatology if fails to improve -No vision issues and is already seeing ophthalmology while actively having this issue though she  did not specifically pointed out.  I doubt mites but repeat referral would be a consideration if fails to improve    Recommended follow up: Return for as needed for new, worsening, persistent symptoms. Future Appointments  Date Time Provider Department Center  05/20/2024  3:00 PM Katrinka Garnette KIDD, MD [facility]    Lab/Order associations:   ICD-10-CM   1. Blepharitis of upper and lower eyelids of both eyes, unspecified type  H01.00A    H01.00B      I personally spent a total of 20 minutes in the care of the patient  today including preparing to see the patient, getting/reviewing separately obtained history, performing a medically appropriate exam/evaluation, counseling and educating, and documenting clinical information in the EHR.   Return precautions advised.  Garnette Katrinka, MD

## 2024-05-14 NOTE — Patient Instructions (Addendum)
 See sheet with conservative measures- lets try those and if possible make up less trial in this area for 2 weeks while you also do the bacitracin at bedtime as well to see if we can make some progess  If not- reach out and I will do a referral to [facility] dermatology or your can call them  Recommended follow up: Return for as needed for new, worsening, persistent symptoms.

## 2024-05-20 ENCOUNTER — Ambulatory Visit: Payer: No Typology Code available for payment source | Admitting: Family Medicine

## 2024-05-20 ENCOUNTER — Encounter: Payer: Self-pay | Admitting: Family Medicine

## 2024-05-20 VITALS — BP 122/78 | HR 81 | Temp 98.2°F | Ht 71.0 in | Wt 275.8 lb

## 2024-05-20 DIAGNOSIS — Z130 Encounter for screening for diseases of the blood and blood-forming organs and certain disorders involving the immune mechanism: Secondary | ICD-10-CM | POA: Diagnosis not present

## 2024-05-20 DIAGNOSIS — E669 Obesity, unspecified: Secondary | ICD-10-CM | POA: Diagnosis not present

## 2024-05-20 DIAGNOSIS — H0100A Unspecified blepharitis right eye, upper and lower eyelids: Secondary | ICD-10-CM | POA: Diagnosis not present

## 2024-05-20 DIAGNOSIS — R519 Headache, unspecified: Secondary | ICD-10-CM | POA: Diagnosis not present

## 2024-05-20 DIAGNOSIS — Z0000 Encounter for general adult medical examination without abnormal findings: Secondary | ICD-10-CM | POA: Diagnosis not present

## 2024-05-20 DIAGNOSIS — Z131 Encounter for screening for diabetes mellitus: Secondary | ICD-10-CM | POA: Diagnosis not present

## 2024-05-20 DIAGNOSIS — Z13220 Encounter for screening for lipoid disorders: Secondary | ICD-10-CM | POA: Diagnosis not present

## 2024-05-20 DIAGNOSIS — H0100B Unspecified blepharitis left eye, upper and lower eyelids: Secondary | ICD-10-CM

## 2024-05-20 NOTE — Progress Notes (Signed)
 Phone [phone]   Subjective:  Patient presents today for their annual physical. Chief complaint-noted.   See problem oriented charting- ROS- full  review of systems was completed and negative except for topics noted under acute/chronic concerns   The following were reviewed and entered/updated in epic: Past Medical History:  Diagnosis Date   Allergy    Seasonal   Anxiety    Migraines    since gradeschool- advil and hydrates   Seasonal allergies    claritin prn   Patient Active Problem List   Diagnosis Date Noted   GAD (generalized anxiety disorder) 06/05/2017    Priority: Medium    Migraines     Priority: Medium    Seasonal allergies     Priority: Low   Past Surgical History:  Procedure Laterality Date   WISDOM TOOTH EXTRACTION     not general anesthesia    Family History  Problem Relation Age of Onset   Arthritis Mother    Hearing loss Father        work related   Healthy Brother    Healthy Brother    Uterine cancer Maternal Grandmother        died in early 35s   Cancer Maternal Grandmother    Pancreatic cancer Maternal Grandfather        in late 7s   Cancer Maternal Grandfather    Hypertension Paternal Grandfather        died at 42   Colon cancer Maternal Uncle        in 1s   Cancer Maternal Uncle    Colon cancer Paternal Aunt        age 80    Medications- reviewed and updated Current Outpatient Medications  Medication Sig Dispense Refill   FLUoxetine  (PROZAC ) 20 MG capsule Take 1 capsule (20 mg total) by mouth daily. 90 capsule 3   hydrocortisone  2.5 % cream Apply topically 2 (two) times daily. (Patient taking differently: Apply topically as needed.) 30 g 0   Levonorgestrel (KYLEENA) 19.5 MG IUD Kyleena 17.5 mcg/24 hrs (65yrs) 19.5mg  intrauterine device  Take 1 device every day by intrauterine route.     No current facility-administered medications for this visit.    Allergies-reviewed and updated Allergies[1]  Social History    Social History Narrative   Lives alone since sept 2021-brother with her in 2023. Parents still in [place].        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  Didn't want to do that for work.    Moved to [place] and worked at [employer] for a while.    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       Hobbies: hiking, movies, video games- switch, ps5   Objective  Objective:  BP 122/78 (BP Location: Left Arm, Patient Position: Sitting, Cuff Size: Normal)   Pulse 81   Temp 98.2 F (36.8 C) (Temporal)   Ht 5' 11 (1.803 m)   Wt 275 lb 12.8 oz (125.1 kg)   SpO2 96%   BMI 38.47 kg/m  Gen: NAD, resting comfortably HEENT: Mucous membranes are moist. Oropharynx normal. Right nasal passageway more narrow than left- ? Deviated septum Neck: no thyromegaly CV: RRR no murmurs rubs or gallops Lungs: CTAB no crackles, wheeze, rhonchi Abdomen: soft/nontender/nondistended/normal bowel sounds. No rebound or guarding.  Ext: no edema Skin: warm, dry Neuro: grossly normal, moves all extremities, PERRLA   Assessment and  Plan   33 y.o. female presenting for annual physical.  Health Maintenance counseling: 1. Anticipatory guidance: Patient counseled regarding regular dental exams -q6 months, eye exams -yearly,  avoiding smoking and second hand smoke , limiting alcohol to 1 beverage per day- 2 a week , no illicit drugs.   2. Risk factor reduction:  Advised patient of need for regular exercise and diet rich and fruits and vegetables to reduce risk of heart attack and stroke.  Exercise- has plan to improve this in next year- not currently exercising- wants to go back to gym. Success with trainer in past- may restart Diet/weight management-12 lbs up from last year- encouraged to reverse this trend.  Wt Readings from Last 3 Encounters:  05/20/24 275 lb 12.8 oz (125.1 kg)  05/19/23 263 lb 12.8 oz (119.7 kg)  04/07/23 276 lb (125.2 kg)  3.  Immunizations/screenings/ancillary studies- holding off on COVID, flu. Had HBV vaccine in childhood Immunization History  Administered Date(s) Administered   Influenza-Unspecified 03/17/2018   Janssen (J&J) SARS-COV-2 Vaccination 09/11/2019   PFIZER(Purple Top)SARS-COV-2 Vaccination 05/31/2020   Tdap 06/05/2017   4. Cervical cancer screening- we have 2022 on fine- WNL and HPV negative- needs to schedule obgyn 5. Breast cancer screening-  breast exam with GYN and mammogram - starting at 38 6. Colon cancer screening - paternal aunt colon cancer at 36- may refer at 21 7. Skin cancer screening- no dermatology - referring for reasons below. advised regular sunscreen use. Denies worrisome, changing, or new skin lesions.  8. Birth control/STD check- kyleena IUD in place. Declines STD screen 9. Osteoporosis screening at 30- will plan on this 10. Smoking associated screening - never smoker  Status of chronic or acute concerns    #more narrow in right nasal passageway possible deviated setpum could refer to ENT but she wants to think this over- can message us  later  # Headaches with history of migraines but not clear these are migraines S:ongoing issues with headaches. Have been concerned about tension headaches. In march we had tried prednisone  she didn't end of taking- we were trying to avoid rebound headache(s) at that time- at that time was doing 600mg  ibuprofen. She's about twice a week max with ibuprofen but tries to limit to that  Headache(s) at least once a week.  Can be frontal, can be posterior, sometimes moves. Can be both or one side. Weather can trigger and sometimes not.   In childhood had been diagnosed with migraines. No visual aura like she had in childhood though.    Has tried vitamin D not sure if helping A/P: ongoing long term issues and wants to look at prevention options- refer to neurology for their expert opinion   #blepharitis only mild improvement with warm compresses,  twice daily lid massage, twice daily lid washing. She tried a couple days of bacitracin and time off makeup but just hasn't seen much improvement. Vaseline does help with dryness at least. Will refer to [facility] dermatology for their opinion   # Anxiety S:Medication: Prozac  20 mg     05/20/2024    2:45 PM 05/14/2024   11:54 AM 05/19/2023    2:59 PM 04/07/2023   10:47 AM  GAD 7 : Generalized Anxiety Score  Nervous, Anxious, on Edge 0 1 0 0  Control/stop worrying 0 0 0 0  Worry too much - different things 0 0 0 0  Trouble relaxing 0 0 0 0  Restless 0 0 0 0  Easily annoyed or irritable 0 0  0 0  Afraid - awful might happen 0 0 0 0  Total GAD 7 Score 0 1 0 0  Anxiety Difficulty Not difficult at all Not difficult at all Not difficult at all Not difficult at all   A/P: reasonable control- continue current medications    Recommended follow up: Return in about 1 year (around 05/20/2025) for physical or sooner if needed.Schedule b4 you leave.  Lab/Order associations:NOT fasting   ICD-10-CM   1. Preventative health care  Z00.00     2. Blepharitis of upper and lower eyelids of both eyes, unspecified type  H01.00A Ambulatory referral to Dermatology   H01.00B     3. Nonintractable episodic headache, unspecified headache type  R51.9 Ambulatory referral to Neurology      No orders of the defined types were placed in this encounter.   Return precautions advised.  Garnette Lukes, MD      [1] No Known Allergies

## 2024-05-20 NOTE — Patient Instructions (Addendum)
 Please call and schedule your gynecology visit  We have placed a referral for you today to neurology and dermatology- please call their # if you do not hear within a week  - neurology on sheet  Dermatology  Address: [address] [place], [place] [address]  Phone: [phone]  Please stop by lab before you go If you have mychart- we will send your results within 3 business days of us  receiving them.  If you do not have mychart- we will call you about results within 5 business days of us  receiving them.  *please also note that you will see labs on mychart as soon as they post. I will later go in and write notes on them- will say notes from Dr. Katrinka   Recommended follow up: Return in about 1 year (around 05/20/2025) for physical or sooner if needed.Schedule b4 you leave.

## 2024-05-21 ENCOUNTER — Ambulatory Visit: Payer: Self-pay | Admitting: Family Medicine

## 2024-05-21 LAB — COMPREHENSIVE METABOLIC PANEL WITH GFR
ALT: 13 U/L (ref 3–35)
AST: 15 U/L (ref 5–37)
Albumin: 4.6 g/dL (ref 3.5–5.2)
Alkaline Phosphatase: 56 U/L (ref 39–117)
BUN: 14 mg/dL (ref 6–23)
CO2: 25 meq/L (ref 19–32)
Calcium: 9.4 mg/dL (ref 8.4–10.5)
Chloride: 104 meq/L (ref 96–112)
Creatinine, Ser: 0.77 mg/dL (ref 0.40–1.20)
GFR: 100.11 mL/min
Glucose, Bld: 83 mg/dL (ref 70–99)
Potassium: 4.3 meq/L (ref 3.5–5.1)
Sodium: 137 meq/L (ref 135–145)
Total Bilirubin: 0.3 mg/dL (ref 0.2–1.2)
Total Protein: 7.3 g/dL (ref 6.0–8.3)

## 2024-05-21 LAB — CBC WITH DIFFERENTIAL/PLATELET
Basophils Absolute: 0.1 K/uL (ref 0.0–0.1)
Basophils Relative: 1.2 % (ref 0.0–3.0)
Eosinophils Absolute: 0.3 K/uL (ref 0.0–0.7)
Eosinophils Relative: 3.1 % (ref 0.0–5.0)
HCT: 42.3 % (ref 36.0–46.0)
Hemoglobin: 14.5 g/dL (ref 12.0–15.0)
Lymphocytes Relative: 37.1 % (ref 12.0–46.0)
Lymphs Abs: 3 K/uL (ref 0.7–4.0)
MCHC: 34.3 g/dL (ref 30.0–36.0)
MCV: 92.9 fl (ref 78.0–100.0)
Monocytes Absolute: 0.4 K/uL (ref 0.1–1.0)
Monocytes Relative: 4.8 % (ref 3.0–12.0)
Neutro Abs: 4.4 K/uL (ref 1.4–7.7)
Neutrophils Relative %: 53.8 % (ref 43.0–77.0)
Platelets: 393 K/uL (ref 150.0–400.0)
RBC: 4.55 Mil/uL (ref 3.87–5.11)
RDW: 12.5 % (ref 11.5–15.5)
WBC: 8.2 K/uL (ref 4.0–10.5)

## 2024-05-21 LAB — LIPID PANEL
Cholesterol: 213 mg/dL — ABNORMAL HIGH (ref 28–200)
HDL: 52.9 mg/dL
LDL Cholesterol: 135 mg/dL — ABNORMAL HIGH (ref 10–99)
NonHDL: 160.46
Total CHOL/HDL Ratio: 4
Triglycerides: 127 mg/dL (ref 10.0–149.0)
VLDL: 25.4 mg/dL (ref 0.0–40.0)

## 2024-05-21 LAB — HEMOGLOBIN A1C: Hgb A1c MFr Bld: 5.3 % (ref 4.6–6.5)

## 2024-05-21 LAB — TSH: TSH: 2.23 u[IU]/mL (ref 0.35–5.50)

## 2024-05-23 ENCOUNTER — Other Ambulatory Visit: Payer: Self-pay | Admitting: Family Medicine

## 2024-08-24 ENCOUNTER — Ambulatory Visit: Payer: Self-pay | Admitting: Neurology

## 2025-05-26 ENCOUNTER — Encounter: Admitting: Family Medicine

## 2025-06-09 ENCOUNTER — Encounter: Admitting: Family Medicine
